# Patient Record
Sex: Female | Born: 1980
Health system: Southern US, Community
[De-identification: ages and names within clinical notes are randomized; demographics above are authoritative.]

## PROBLEM LIST (undated history)

## (undated) ENCOUNTER — Emergency Department (HOSPITAL_COMMUNITY): Admission: EM

## (undated) DIAGNOSIS — Z1379 Encounter for other screening for genetic and chromosomal anomalies: Principal | ICD-10-CM

## (undated) DIAGNOSIS — N939 Abnormal uterine and vaginal bleeding, unspecified: Secondary | ICD-10-CM

## (undated) DIAGNOSIS — I10 Essential (primary) hypertension: Secondary | ICD-10-CM

## (undated) DIAGNOSIS — Z800 Family history of malignant neoplasm of digestive organs: Secondary | ICD-10-CM

## (undated) DIAGNOSIS — Z803 Family history of malignant neoplasm of breast: Secondary | ICD-10-CM

## (undated) DIAGNOSIS — R011 Cardiac murmur, unspecified: Secondary | ICD-10-CM

## (undated) DIAGNOSIS — D259 Leiomyoma of uterus, unspecified: Secondary | ICD-10-CM

## (undated) DIAGNOSIS — R05 Cough: Secondary | ICD-10-CM

## (undated) DIAGNOSIS — R058 Other specified cough: Secondary | ICD-10-CM

## (undated) HISTORY — DX: Family history of malignant neoplasm of digestive organs: Z80.0

## (undated) HISTORY — DX: Encounter for other screening for genetic and chromosomal anomalies: Z13.79

## (undated) HISTORY — DX: Family history of malignant neoplasm of breast: Z80.3

---
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
## 2004-06-11 ENCOUNTER — Inpatient Hospital Stay (HOSPITAL_COMMUNITY): Admission: AD | Admit: 2004-06-11 | Discharge: 2004-06-11 | Payer: Self-pay | Admitting: Obstetrics and Gynecology

## 2004-09-03 ENCOUNTER — Ambulatory Visit (HOSPITAL_COMMUNITY): Admission: RE | Admit: 2004-09-03 | Discharge: 2004-09-03 | Payer: Self-pay | Admitting: Obstetrics and Gynecology

## 2004-11-20 ENCOUNTER — Inpatient Hospital Stay (HOSPITAL_COMMUNITY): Admission: AD | Admit: 2004-11-20 | Discharge: 2004-11-20 | Payer: Self-pay | Admitting: Obstetrics & Gynecology

## 2004-11-20 ENCOUNTER — Ambulatory Visit: Payer: Self-pay | Admitting: Family Medicine

## 2004-11-21 ENCOUNTER — Inpatient Hospital Stay (HOSPITAL_COMMUNITY): Admission: AD | Admit: 2004-11-21 | Discharge: 2004-11-21 | Payer: Self-pay | Admitting: *Deleted

## 2005-01-04 ENCOUNTER — Inpatient Hospital Stay (HOSPITAL_COMMUNITY): Admission: AD | Admit: 2005-01-04 | Discharge: 2005-01-06 | Payer: Self-pay | Admitting: Obstetrics & Gynecology

## 2005-01-04 ENCOUNTER — Ambulatory Visit: Payer: Self-pay | Admitting: Certified Nurse Midwife

## 2006-06-13 ENCOUNTER — Emergency Department (HOSPITAL_COMMUNITY): Admission: EM | Admit: 2006-06-13 | Discharge: 2006-06-13 | Payer: Self-pay | Admitting: Emergency Medicine

## 2006-10-11 ENCOUNTER — Emergency Department (HOSPITAL_COMMUNITY): Admission: EM | Admit: 2006-10-11 | Discharge: 2006-10-11 | Payer: Self-pay | Admitting: Emergency Medicine

## 2009-03-01 ENCOUNTER — Ambulatory Visit (HOSPITAL_COMMUNITY): Admission: RE | Admit: 2009-03-01 | Discharge: 2009-03-01 | Payer: Self-pay | Admitting: Obstetrics and Gynecology

## 2009-03-01 HISTORY — PX: OTHER SURGICAL HISTORY: SHX169

## 2010-01-04 ENCOUNTER — Emergency Department (HOSPITAL_COMMUNITY): Admission: EM | Admit: 2010-01-04 | Discharge: 2010-01-04 | Payer: Self-pay | Admitting: Emergency Medicine

## 2010-01-31 ENCOUNTER — Emergency Department (HOSPITAL_BASED_OUTPATIENT_CLINIC_OR_DEPARTMENT_OTHER): Admission: EM | Admit: 2010-01-31 | Discharge: 2010-01-31 | Payer: Self-pay | Admitting: Emergency Medicine

## 2010-06-24 LAB — CBC
HCT: 35.3 % — ABNORMAL LOW (ref 36.0–46.0)
MCV: 82.9 fL (ref 78.0–100.0)
Platelets: 261 10*3/uL (ref 150–400)
WBC: 6.7 10*3/uL (ref 4.0–10.5)

## 2010-06-24 LAB — PREGNANCY, URINE: Preg Test, Ur: NEGATIVE

## 2012-05-12 ENCOUNTER — Ambulatory Visit (INDEPENDENT_AMBULATORY_CARE_PROVIDER_SITE_OTHER): Payer: 59 | Admitting: Family Medicine

## 2012-05-12 VITALS — BP 128/83 | HR 76 | Temp 98.9°F | Resp 16 | Ht 66.5 in | Wt 233.0 lb

## 2012-05-12 DIAGNOSIS — B009 Herpesviral infection, unspecified: Secondary | ICD-10-CM

## 2012-05-12 DIAGNOSIS — H5789 Other specified disorders of eye and adnexa: Secondary | ICD-10-CM

## 2012-05-12 DIAGNOSIS — H579 Unspecified disorder of eye and adnexa: Secondary | ICD-10-CM

## 2012-05-12 DIAGNOSIS — R109 Unspecified abdominal pain: Secondary | ICD-10-CM

## 2012-05-12 DIAGNOSIS — I499 Cardiac arrhythmia, unspecified: Secondary | ICD-10-CM

## 2012-05-12 DIAGNOSIS — I493 Ventricular premature depolarization: Secondary | ICD-10-CM

## 2012-05-12 DIAGNOSIS — R5383 Other fatigue: Secondary | ICD-10-CM

## 2012-05-12 DIAGNOSIS — R6883 Chills (without fever): Secondary | ICD-10-CM

## 2012-05-12 DIAGNOSIS — R5381 Other malaise: Secondary | ICD-10-CM

## 2012-05-12 DIAGNOSIS — I4949 Other premature depolarization: Secondary | ICD-10-CM

## 2012-05-12 DIAGNOSIS — D649 Anemia, unspecified: Secondary | ICD-10-CM

## 2012-05-12 LAB — POCT CBC
MCHC: 31 g/dL — AB (ref 31.8–35.4)
MID (cbc): 0.6 (ref 0–0.9)
Platelet Count, POC: 235 10*3/uL (ref 142–424)
RBC: 4.46 M/uL (ref 4.04–5.48)
RDW, POC: 15.2 %
WBC: 4.6 10*3/uL (ref 4.6–10.2)

## 2012-05-12 LAB — BASIC METABOLIC PANEL
CO2: 28 mEq/L (ref 19–32)
Calcium: 9.3 mg/dL (ref 8.4–10.5)
Creat: 0.67 mg/dL (ref 0.50–1.10)

## 2012-05-12 MED ORDER — VALACYCLOVIR HCL 1 G PO TABS: ORAL_TABLET | ORAL | Status: DC

## 2012-05-12 NOTE — Progress Notes (Signed)
Subjective: 32 year old lady who has been feeling bad for about 5 days. Sunday she started feeling fatigued. She also had stomach pains. No nausea or vomiting. She had cramping. On Monday she felt lousy with more of the fatigue and stomach pains. Teacher. 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 Today she feels bad again. She is broken out with a fever blister at the left side of her mouth and lateral to the left eye. She's never had these before.  Objective No acute distress. Her eyes appear normal. There is the herpetic-looking lesion just lateral to the left lateral epicanthal fold. Her mouth has a small blister just lateral to the lips at the ankle. Throat was clear. Neck supple without nodes. Chest is clear to auscultation. Heart has moderately frequent ectopic beats. And soft without mass or tenderness.  Assessment: Herpetic ulcer x2 Fatigue Abdominal discomfort Menstrual onset Cardiac ectopy  Plan: Check EKG, HSV titers, CBC  Will treat with Valtrex  Results for orders placed in visit on 05/12/12  POCT CBC      Result Value Range   WBC 4.6  4.6 - 10.2 K/uL   Lymph, poc 2.1  0.6 - 3.4   POC LYMPH PERCENT 46.1  10 - 50 %L   MID (cbc) 0.6  0 - 0.9   POC MID % 13.0 (*) 0 - 12 %M   POC Granulocyte 1.9 (*) 2 - 6.9   Granulocyte percent 40.9  37 - 80 %G   RBC 4.46  4.04 - 5.48 M/uL   Hemoglobin 11.3 (*) 12.2 - 16.2 g/dL   HCT, POC 40.9 (*) 81.1 - 47.9 %   MCV 81.6  80 - 97 fL   MCH, POC 25.3 (*) 27 - 31.2 pg   MCHC 31.0 (*) 31.8 - 35.4 g/dL   RDW, POC 91.4     Platelet Count, POC 235  142 - 424 K/uL   MPV 9.5  0 - 99.8 fL   Diagnoses: HSV 1 Viral syndrome PVCs Fatigue Anemia  Plan: Take iron Valtrex Reassurance about the palpitations Return if needed

## 2012-05-12 NOTE — Patient Instructions (Addendum)
Take Valtrex as directed  Return if worse. If any concern about eye involvement come in immediately.  If palpitations have her give you symptoms that are concerning you come back.

## 2012-05-13 LAB — HSV(HERPES SIMPLEX VRS) I + II AB-IGG
HSV 1 Glycoprotein G Ab, IgG: 8.71 IV — ABNORMAL HIGH
HSV 2 Glycoprotein G Ab, IgG: 0.17 IV

## 2012-07-18 ENCOUNTER — Ambulatory Visit (INDEPENDENT_AMBULATORY_CARE_PROVIDER_SITE_OTHER): Payer: 59 | Admitting: Family Medicine

## 2012-07-18 VITALS — BP 131/84 | HR 83 | Temp 98.9°F | Resp 17 | Ht 66.5 in | Wt 240.0 lb

## 2012-07-18 DIAGNOSIS — H6120 Impacted cerumen, unspecified ear: Secondary | ICD-10-CM

## 2012-07-18 DIAGNOSIS — J029 Acute pharyngitis, unspecified: Secondary | ICD-10-CM

## 2012-07-18 DIAGNOSIS — J020 Streptococcal pharyngitis: Secondary | ICD-10-CM

## 2012-07-18 DIAGNOSIS — H9209 Otalgia, unspecified ear: Secondary | ICD-10-CM

## 2012-07-18 DIAGNOSIS — H6121 Impacted cerumen, right ear: Secondary | ICD-10-CM

## 2012-07-18 DIAGNOSIS — H9201 Otalgia, right ear: Secondary | ICD-10-CM

## 2012-07-18 LAB — POCT RAPID STREP A (OFFICE): Rapid Strep A Screen: NEGATIVE

## 2012-07-18 MED ORDER — AMOXICILLIN 875 MG PO TABS
875.0000 mg | ORAL_TABLET | Freq: Two times a day (BID) | ORAL | Status: DC
Start: 2012-07-18 — End: 2013-10-02

## 2012-07-18 NOTE — Patient Instructions (Addendum)
Strep Throat Strep throat is an infection of the throat caused by a bacteria named Streptococcus pyogenes. Your caregiver may call the infection streptococcal "tonsillitis" or "pharyngitis" depending on whether there are signs of inflammation in the tonsils or back of the throat. Strep throat is most common in children from 74 to 34 years old during the cold months of the year, but it can occur in people of any age during any season. This infection is spread from person to person (contagious) through coughing, sneezing, or other close contact. SYMPTOMS   Fever or chills.  Painful, swollen, red tonsils or throat.  Pain or difficulty when swallowing.  White or yellow spots on the tonsils or throat.  Swollen, tender lymph nodes or "glands" of the neck or under the jaw.  Red rash all over the body (rare). DIAGNOSIS  Many different infections can cause the same symptoms. A test must be done to confirm the diagnosis so the right treatment can be given. A "rapid strep test" can help your caregiver make the diagnosis in a few minutes. If this test is not available, a light swab of the infected area can be used for a throat culture test. If a throat culture test is done, results are usually available in a day or two. TREATMENT  Strep throat is treated with antibiotic medicine. HOME CARE INSTRUCTIONS   Gargle with 1 tsp of salt in 1 cup of warm water, 3 to 4 times per day or as needed for comfort.  Family members who also have a sore throat or fever should be tested for strep throat and treated with antibiotics if they have the strep infection.  Make sure everyone in your household washes their hands well.  Do not share food, drinking cups, or personal items that could cause the infection to spread to others.  You may need to eat a soft food diet until your sore throat gets better.  Drink enough water and fluids to keep your urine clear or pale yellow. This will help prevent dehydration.  Get  plenty of rest.  Stay home from school, daycare, or work until you have been on antibiotics for 24 hours.  Only take over-the-counter or prescription medicines for pain, discomfort, or fever as directed by your caregiver.  If antibiotics are prescribed, take them as directed. Finish them even if you start to feel better. SEEK MEDICAL CARE IF:   The glands in your neck continue to enlarge.  You develop a rash, cough, or earache.  You cough up green, yellow-brown, or bloody sputum.  You have pain or discomfort not controlled by medicines.  Your problems seem to be getting worse rather than better. SEEK IMMEDIATE MEDICAL CARE IF:   You develop any new symptoms such as vomiting, severe headache, stiff or painful neck, chest pain, shortness of breath, or trouble swallowing.  You develop severe throat pain, drooling, or changes in your voice.  You develop swelling of the neck, or the skin on the neck becomes red and tender.  You have a fever.  You develop signs of dehydration, such as fatigue, dry mouth, and decreased urination.  You become increasingly sleepy, or you cannot wake up completely. Document Released: 03/06/2000 Document Revised: 06/01/2011 Document Reviewed: 05/08/2010 [facility] Patient Information 2013 [place], [place].    Amoxicillin one twice daily

## 2012-07-18 NOTE — Progress Notes (Signed)
Subjective: Patient is here with a sore throat the last 2 or 3 days. Last night she had a lot of pain in her right ear during the night.  I inquired about whether she taken iron since her last visit, and she had not. Somehow I did not adequately communicate that to her, but she was a little anemic. I have talked with her and advised her to take an over-the-counter iron pill one daily for the next couple of months.  Objective: Her left TM is normal and there is no wax in the canal. The right TM is occluded by wax. I was able to pick around the corner of the wax and see the posterior aspect of the drum which does not look inflamed. We will try to irrigate the wax out. Her throat is erythematous, with a tiny bit of exudate on the right. Strep screen was taken. Neck supple without significant nodes. Chest is clear. Heart regular.  Assessment: Pharyngitis Right otitis Cerumen impaction  Plan: Irrigate the wax out of the ear and get a better look at the drum. Strep screen is pending. Then we'll decide treatment.  Strep screen is positive Patient has normal tympanic membranes Treat her with amoxicillin

## 2013-01-26 ENCOUNTER — Other Ambulatory Visit: Payer: Self-pay

## 2013-10-02 ENCOUNTER — Ambulatory Visit (INDEPENDENT_AMBULATORY_CARE_PROVIDER_SITE_OTHER): Payer: 59 | Admitting: Emergency Medicine

## 2013-10-02 VITALS — BP 132/86 | HR 60 | Temp 98.2°F | Resp 16 | Ht 66.0 in | Wt 230.0 lb

## 2013-10-02 DIAGNOSIS — IMO0002 Reserved for concepts with insufficient information to code with codable children: Secondary | ICD-10-CM

## 2013-10-02 DIAGNOSIS — S83207A Unspecified tear of unspecified meniscus, current injury, left knee, initial encounter: Secondary | ICD-10-CM

## 2013-10-02 MED ORDER — ACETAMINOPHEN-CODEINE #3 300-30 MG PO TABS
1.0000 | ORAL_TABLET | ORAL | Status: DC | PRN
Start: 2013-10-02 — End: 2015-04-09

## 2013-10-02 MED ORDER — NAPROXEN SODIUM 550 MG PO TABS: 550.0000 mg | ORAL_TABLET | Freq: Two times a day (BID) | ORAL | Status: AC

## 2013-10-02 NOTE — Progress Notes (Signed)
[facility] [address] [place] [address] [phone]  Date:  10/02/2013   Name:  Kristen Hines   DOB:  November 01, 1980   MRN:  [id number]  PCP:  Kristine Garbe, MD    Chief Complaint: Knee Pain   History of Present Illness:  Kristen Hines is a 33 y.o. very pleasant female patient who presents with the following:  History of meniscus tear right knee.  Was on a 4 mile walk and developed pain in left knee the following day.  Has a click with flexion and extension but is not able to flex past 90 degrees.  Pain is lessening with elevation and ice.  No history of direct injury.  No improvement with over the counter medications or other home remedies. Denies other complaint or health concern today.   There are no active problems to display for this patient.   History reviewed. No pertinent past medical history.  Past Surgical History  Procedure Laterality Date  . Tubal ligation    . Ablation      History  Substance Use Topics  . Smoking status: Never Smoker   . Smokeless tobacco: Not on file  . Alcohol Use: No    Family History  Problem Relation Age of Onset  . Hypertension Mother   . Breast cancer Mother   . Hypertension Father   . Heart disease Father   . Diabetes Father     No Known Allergies  Medication list has been reviewed and updated.  No current outpatient prescriptions on file prior to visit.   No current facility-administered medications on file prior to visit.    Review of Systems:  As per HPI, otherwise negative.    Physical Examination: Filed Vitals:   10/02/13 1850  BP: 132/86  Pulse: 60  Temp: 98.2 F (36.8 C)  Resp: 16   Filed Vitals:   10/02/13 1850  Height: 5\' 6"  (1.676 m)  Weight: 230 lb (104.327 kg)   Body mass index is 37.14 kg/(m^2). Ideal Body Weight: Weight in (lb) to have BMI = 25: 154.6   GEN: WDWN, NAD, Non-toxic, Alert & Oriented x 3 HEENT: Atraumatic, Normocephalic.  Ears and Nose: No  external deformity. EXTR: No clubbing/cyanosis/edema NEURO: Normal gait.  PSYCH: Normally interactive. Conversant. Not depressed or anxious appearing.  Calm demeanor.  LEFT knee:   Little tenderness laterally.  Full extension, 90 degrees of flexion.  Joint stable.  No effusino  Assessment and Plan: Likely meniscus tear Refused crutches Anaprox tyl #3 Ortho if not improved   Signed,  Ellison Carwin, MD

## 2013-10-02 NOTE — Patient Instructions (Signed)
Meniscus Tear with Phase I Rehab The meniscus is a C-shaped cartilage structure, located in the knee joint between the thigh bone (femur) and the shinbone (tibia). Two menisci are located in each knee joint: the inner and outer meniscus. The meniscus acts as an adapter between the thigh bone and shinbone, allowing them to fit properly together. It also functions as a shock absorber, to reduce the stress placed on the knee joint and to help supply nutrients to the knee joint cartilage. As people age, the meniscus begins to harden and become more vulnerable to injury. Meniscus tears are a common injury, especially in older athletes. Inner meniscus tears are more common than outer meniscus tears.  SYMPTOMS   Pain in the knee, especially with standing or squatting with the affected leg.  Tenderness along the joint line.  Swelling in the knee joint (effusion), usually starting 1 to 2 days after injury.  Locking or catching of the knee joint, causing inability to straighten the knee completely.  Giving way or buckling of the knee. CAUSES  A meniscus tear occurs when a force is placed on the meniscus that is greater than it can handle. Common causes of injury include:  Direct hit (trauma) to the knee.  Twisting, pivoting, or cutting (rapidly changing direction while running), kneeling or squatting.  Without injury, due to aging. RISK INCREASES WITH:  Contact sports (football, rugby).  Sports in which cleats are used with pivoting (soccer, lacrosse) or sports in which good shoe grip and sudden change in direction are required (racquetball, basketball, squash).  Previous knee injury.  Associated knee injury, particularly ligament injuries.  Poor strength and flexibility. PREVENTION  Warm up and stretch properly before activity.  Maintain physical fitness:  Strength, flexibility, and endurance.  Cardiovascular fitness.  Protect the knee with a brace or elastic bandage.  Wear  properly fitted protective equipment (proper cleats for the surface). PROGNOSIS  Sometimes, meniscus tears heal on their own. However, definitive treatment requires surgery, followed by at least 6 weeks of recovery.  RELATED COMPLICATIONS   Recurring symptoms that result in a chronic problem.  Repeated knee injury, especially if sports are resumed too soon after injury or surgery.  Progression of the tear (the tear gets larger), if untreated.  Arthritis of the knee in later years (with or without surgery).  Complications of surgery, including infection, bleeding, injury to nerves (numbness, weakness, paralysis) continued pain, giving way, locking, nonhealing of meniscus (if repaired), need for further surgery, and knee stiffness (loss of motion). TREATMENT  Treatment first involves the use of ice and medicine, to reduce pain and inflammation. You may find using crutches to walk more comfortable. However, it is okay to bear weight on the injured knee, if the pain will allow it. Surgery is often advised as a definitive treatment. Surgery is performed through an incision near the joint (arthroscopically). The torn piece of the meniscus is removed, and if possible the joint cartilage is repaired. After surgery, the joint must be restrained. After restraint, it is important to perform strengthening and stretching exercises to help regain strength and a full range of motion. These exercises may be completed at home or with a therapist.  MEDICATION  If pain medicine is needed, nonsteroidal anti-inflammatory medicines (aspirin and ibuprofen), or other minor pain relievers (acetaminophen), are often advised.  Do not take pain medicine for 7 days before surgery.  Prescription pain relievers may be given, if your caregiver thinks they are needed. Use only as directed and   only as much as you need. HEAT AND COLD  Cold treatment (icing) should be applied for 10 to 15 minutes every 2 to 3 hours for  inflammation and pain, and immediately after activity that aggravates your symptoms. Use ice packs or an ice massage.  Heat treatment may be used before performing stretching and strengthening activities prescribed by your caregiver, physical therapist, or athletic trainer. Use a heat pack or a warm water soak. SEEK MEDICAL CARE IF:   Symptoms get worse or do not improve in 2 weeks, despite treatment.  New, unexplained symptoms develop. (Drugs used in treatment may produce side effects.) EXERCISES RANGE OF MOTION (ROM) AND STRETCHING EXERCISES - Meniscus Tear, Non-operative, Phase I These are some of the initial exercises with which you may start your rehabilitation program, until you see your caregiver again or until your symptoms are resolved. Remember:   These initial exercises are intended to be gentle. They will help you restore motion without increasing any swelling.  Completing these exercises allows less painful movement and prepares you for the more aggressive strengthening exercises in Phase II.  An effective stretch should be held for at least 30 seconds.  A stretch should never be painful. You should only feel a gentle lengthening or release in the stretched tissue. RANGE OF MOTION - Knee Flexion, Active  Lie on your back with both knees straight. (If this causes back discomfort, bend your healthy knee, placing your foot flat on the floor.)  Slowly slide your heel back toward your buttocks until you feel a gentle stretch in the front of your knee or thigh.  Hold for __________ seconds. Slowly slide your heel back to the starting position. Repeat __________ times. Complete this exercise __________ times per day.  RANGE OF MOTION - Knee Flexion and Extension, Active-Assisted  Sit on the edge of a table or chair with your thighs firmly supported. It may be helpful to place a folded towel under the end of your right / left thigh.  Flexion (bending): Place the ankle of your  healthy leg on top of the other ankle. Use your healthy leg to gently bend your right / left knee until you feel a mild tension across the top of your knee.  Hold for __________ seconds.  Extension (straightening): Switch your ankles so your right / left leg is on top. Use your healthy leg to straighten your right / left knee until you feel a mild tension on the backside of your knee.  Hold for __________ seconds. Repeat __________ times. Complete __________ times per day. STRETCH - Knee Flexion, Supine  Lie on the floor with your right / left heel and foot lightly touching the wall. (Place both feet on the wall if you do not use a door frame.)  Without using any effort, allow gravity to slide your foot down the wall slowly until you feel a gentle stretch in the front of your right / left knee.  Hold this stretch for __________ seconds. Then return the leg to the starting position, using your healthy leg for help, if needed. Repeat __________ times. Complete this stretch __________ times per day.  STRETCH - Knee Extension Sitting  Sit with your right / left leg/heel propped on another chair, coffee table, or foot stool.  Allow your leg muscles to relax, letting gravity straighten out your knee.*  You should feel a stretch behind your right / left knee. Hold this position for __________ seconds. Repeat __________ times. Complete this stretch __________   times per day.  *Your physician, physical therapist or athletic trainer may instruct you place a __________ weight on your thigh, just above your kneecap, to deepen the stretch.  STRENGTHENING EXERCISES - Meniscus Tear, Non-operative, Phase I These exercises may help you when beginning to rehabilitate your injury. They may resolve your symptoms with or without further involvement from your physician, physical therapist or athletic trainer. While completing these exercises, remember:   Muscles can gain both the endurance and the strength  needed for everyday activities through controlled exercises.  Complete these exercises as instructed by your physician, physical therapist or athletic trainer. Progress the resistance and repetitions only as guided. STRENGTH - Quadriceps, Isometrics  Lie on your back with your right / left leg extended and your opposite knee bent.  Gradually tense the muscles in the front of your right / left thigh. You should see either your knee cap slide up toward your hip or increased dimpling just above the knee. This motion will push the back of the knee down toward the floor, mat, or bed on which you are lying.  Hold the muscle as tight as you can, without increasing your pain, for __________ seconds.  Relax the muscles slowly and completely between each repetition. Repeat __________ times. Complete this exercise __________ times per day.  STRENGTH - Quadriceps, Short Arcs   Lie on your back. Place a __________ inch towel roll under your right / left knee, so that the knee bends slightly.  Raise only your lower leg by tightening the muscles in the front of your thigh. Do not allow your thigh to rise.  Hold this position for __________ seconds. Repeat __________ times. Complete this exercise __________ times per day.  OPTIONAL ANKLE WEIGHTS: Begin with ____________________, but DO NOT exceed ____________________. Increase in 1 pound/0.5 kilogram increments. STRENGTH - Quadriceps, Straight Leg Raises  Quality counts! Watch for signs that the quadriceps muscle is working, to be sure you are strengthening the correct muscles and not "cheating" by substituting with healthier muscles.  Lay on your back with your right / left leg extended and your opposite knee bent.  Tense the muscles in the front of your right / left thigh. You should see either your knee cap slide up or increased dimpling just above the knee. Your thigh may even shake a bit.  Tighten these muscles even more and raise your leg 4 to 6  inches off the floor. Hold for __________ seconds.  Keeping these muscles tense, lower your leg.  Relax the muscles slowly and completely in between each repetition. Repeat __________ times. Complete this exercise __________ times per day.  STRENGTH - Hamstring, Curls   Lay on your stomach with your legs extended. (If you lay on a bed, your feet may hang over the edge.)  Tighten the muscles in the back of your thigh to bend your right / left knee up to 90 degrees. Keep your hips flat on the bed.  Hold this position for __________ seconds.  Slowly lower your leg back to the starting position. Repeat __________ times. Complete this exercise __________ times per day.  STRENGTH - Quadriceps, Squats  Stand in a door frame so that your feet and knees are in line with the frame.  Use your hands for balance, not support, on the frame.  Slowly lower your weight, bending at the hips and knees. Keep your lower legs upright so that they are parallel with the door frame. Squat only within the range that does  not increase your knee pain. Never let your hips drop below your knees.  Slowly return upright, pushing with your legs, not pulling with your hands. Repeat __________ times. Complete this exercise __________ times per day.  STRENGTH - Quad/VMO, Isometric   Sit in a chair with your right / left knee slightly bent. With your fingertips, feel the VMO muscle just above the inside of your knee. The VMO is important in controlling the position of your kneecap.  Keeping your fingertips on this muscle. Without actually moving your leg, attempt to drive your knee down as if straightening your leg. You should feel your VMO tense. If you have a difficult time, you may wish to try the same exercise on your healthy knee first.  Tense this muscle as hard as you can without increasing any knee pain.  Hold for __________ seconds. Relax the muscles slowly and completely in between each repetition. Repeat  __________ times. Complete exercise __________ times per day.  Document Released: 03/23/1998 Document Revised: 06/01/2011 Document Reviewed: 06/21/2008 [employer] Patient Information 2015 [employer]. This information is not intended to replace advice given to you by your health care provider. Make sure you discuss any questions you have with your health care provider.  

## 2015-02-25 ENCOUNTER — Other Ambulatory Visit: Payer: Self-pay | Admitting: Obstetrics and Gynecology

## 2015-02-25 DIAGNOSIS — R928 Other abnormal and inconclusive findings on diagnostic imaging of breast: Secondary | ICD-10-CM

## 2015-02-28 NOTE — H&P (Addendum)
Kristen Hines  DICTATION # [id number] CSN# [id number]   Margarette Asal, MD 02/28/2015 8:23 AM

## 2015-02-28 NOTE — H&P (Signed)
Kristen Hines, MATHIOWETZ NO.:  [id number]  MEDICAL RECORD NO.:  [id number]  LOCATION:                               FACILITY:  [facility]  PHYSICIAN:  Ralene Bathe. Matthew Saras, M.D.DATE OF BIRTH:  1980/12/04  DATE OF ADMISSION:  04/16/2015 DATE OF DISCHARGE:                             HISTORY & PHYSICAL   CHIEF COMPLAINT:  Menorrhagia, dysmenorrhea.  HPI:  [name], G2, P2.  She has had 2 vaginal deliveries, prior tubal ligation.  She has a history of prior ablation in 2010, but continues now to have significant problems related to bleeding and menstrual pain.  Ultrasound evaluation in our office dated February 22, 2015, demonstrated 4.8 x 3.7 intramural fibroid.  Adnexa unremarkable. She presents now for LAVH bilateral salpingectomy.  This procedure including specific risks related to bleeding, infection, transfusion, adjacent organ injury, possible need to complete the surgery by open technique, rationale for bilateral salpingectomy, all discussed with her which she understands and accepts.  PAST MEDICAL HISTORY:  Allergies:  None.  PAST SURGICAL HISTORY:  Endometrial ablation in 2010, also tubal ligation.  CURRENT MEDICATIONS:  Motrin p.r.n.  FAMILY HISTORY:  Significant for diabetes.  SOCIAL HISTORY:  Denies alcohol, tobacco, or drug use.  PHYSICAL EXAM:  VITAL SIGNS:  Temp 98.2, blood pressure 130/88. HEENT:  Unremarkable. NECK:  Supple without masses. LUNGS:  Clear. CARDIOVASCULAR:  Regular rate and rhythm without murmurs, rubs, gallops noted. BREASTS:  Without masses. ABDOMEN:  Soft, flat, nontender.  Vulva, vagina, cervix normal.  Uterus upper limit normal size, mobile.  Adnexa negative.  Last Pap, November 2016, was normal except for trich, which was treated with tinidazole. EXTREMITIES:  Unremarkable. NEUROLOGIC:  Unremarkable.  IMPRESSION:  Menorrhagia and dysmenorrhea, failed ablation, leiomyoma.  PLAN:  LAVH, bilateral salpingectomy.   Procedure and risks discussed as above.     Yaneth Fairbairn M. Matthew Saras, M.D.     [facility]/MEDQ  D:  02/28/2015  T:  02/28/2015  Job:  [id number]

## 2015-03-04 ENCOUNTER — Ambulatory Visit
Admission: RE | Admit: 2015-03-04 | Discharge: 2015-03-04 | Disposition: A | Discharge status: Home / Self Care | Payer: 59 | Source: Ambulatory Visit | Attending: Obstetrics and Gynecology | Admitting: Obstetrics and Gynecology

## 2015-03-04 DIAGNOSIS — R928 Other abnormal and inconclusive findings on diagnostic imaging of breast: Secondary | ICD-10-CM

## 2015-04-03 NOTE — H&P (Signed)
Kristen Kristen Hines, Kristen Hines NO.:  [id number]  MEDICAL RECORD NO.:  [id number]  LOCATION:                                 FACILITY:  PHYSICIAN:  Ralene Bathe. Matthew Saras, M.D.    DATE OF BIRTH:  DATE OF ADMISSION:  04/16/2015 DATE OF DISCHARGE:                             HISTORY & PHYSICAL   CHIEF COMPLAINT:  Symptomatic fibroids and menorrhagia, status post ablation.  HPI:  [name], G2, P2, prior tubal and an endometrial ablation in 2010.  She continues to have problems with menorrhagia.  Last ultrasound 02/22/2015 showed a 4.8 x 3.7 intramural fibroid.  Adnexa negative.  She presents at this time for a definitive LAVH bilateral salpingectomy. This procedure including specific risks related to bleeding, infection, adjacent organ injury, transfusion, wound infection, phlebitis, the possibility of having to complete the surgery by an open technique.  All reviewed with her, which she understands and accepts.  PAST MEDICAL HISTORY:  She has had 2 vaginal deliveries and a resection in 1998, endometrial ablation in 2010 at the time of tubal ligation.  At the time of her ablation, no significant abnormalities were noted.  CURRENT MEDICATIONS:  None.  FAMILY HISTORY:  Significant for history of migraine headache, heart disease, and diabetes.  SOCIAL HISTORY:  Denies alcohol, tobacco or drug use.  PHYSICAL EXAMINATION:  VITAL SIGNS:  Temp 98.2 and blood pressure 150/100. HEENT:  Unremarkable. NECK:  Supple without masses. LUNGS:  Clear. CARDIOVASCULAR:  Regular rate and rhythm without murmurs, rubs or gallops. BREASTS:  Without masses. ABDOMEN:  Soft, flat, and nontender.  Vulva, vagina, and cervix normal. Uterus upper limit of normal size.  Adnexa negative and mobile.  Most recent Pap was normal. EXTREMITIES:  Unremarkable. NEUROLOGIC:  Unremarkable.  IMPRESSION:  Symptomatic leiomyoma, menorrhagia, and failed ablation.  PLAN:  LAVH, bilateral salpingectomy.   Procedure and risks discussed as above.     Zeanna Sunde M. Matthew Saras, M.D.     RMH/MEDQ  D:  04/02/2015  T:  04/02/2015  Job:  [id number]

## 2015-04-09 ENCOUNTER — Encounter (HOSPITAL_BASED_OUTPATIENT_CLINIC_OR_DEPARTMENT_OTHER): Payer: Self-pay | Admitting: *Deleted

## 2015-04-09 NOTE — Progress Notes (Signed)
NPO AFTER MN.  ARRIVE AT 0600.  NEEDS URINE PREG.  GETTING LAB WORK DONE MON. 04-15-2015 (CBC, CMET, T & S).

## 2015-04-15 LAB — COMPREHENSIVE METABOLIC PANEL
ALBUMIN: 4.1 g/dL (ref 3.5–5.0)
ALT: 13 U/L — AB (ref 14–54)
ANION GAP: 6 (ref 5–15)
AST: 13 U/L — ABNORMAL LOW (ref 15–41)
Alkaline Phosphatase: 53 U/L (ref 38–126)
BUN: 10 mg/dL (ref 6–20)
CHLORIDE: 106 mmol/L (ref 101–111)
CO2: 29 mmol/L (ref 22–32)
Calcium: 9.1 mg/dL (ref 8.9–10.3)
Creatinine, Ser: 0.85 mg/dL (ref 0.44–1.00)
GFR calc non Af Amer: >60 mL/min (ref >60)
Glucose, Bld: 90 mg/dL (ref 65–99)
Potassium: 4.2 mmol/L (ref 3.5–5.1)
SODIUM: 141 mmol/L (ref 135–145)
Total Bilirubin: 0.5 mg/dL (ref 0.3–1.2)
Total Protein: 7.3 g/dL (ref 6.5–8.1)

## 2015-04-15 LAB — TYPE AND SCREEN
ABO/RH(D): O POS
ANTIBODY SCREEN: NEGATIVE

## 2015-04-15 LAB — CBC
HCT: 34.1 % — ABNORMAL LOW (ref 36.0–46.0)
Hemoglobin: 10.7 g/dL — ABNORMAL LOW (ref 12.0–15.0)
MCH: 25.8 pg — AB (ref 26.0–34.0)
MCHC: 31.4 g/dL (ref 30.0–36.0)
MCV: 82.2 fL (ref 78.0–100.0)
PLATELETS: 217 10*3/uL (ref 150–400)
RBC: 4.15 MIL/uL (ref 3.87–5.11)
RDW: 13.8 % (ref 11.5–15.5)
WBC: 5.2 10*3/uL (ref 4.0–10.5)

## 2015-04-15 LAB — ABO/RH: ABO/RH(D): O POS

## 2015-04-15 NOTE — Anesthesia Preprocedure Evaluation (Addendum)
Anesthesia Evaluation  Patient identified by MRN, date of birth, ID band Patient awake    Reviewed: Allergy & Precautions, NPO status , Patient's Chart, lab work & pertinent test results  History of Anesthesia Complications Negative for: history of anesthetic complications  Airway Mallampati: II  TM Distance: >3 FB Neck ROM: Full    Dental no notable dental hx. (+) Dental Advisory Given, Missing   Pulmonary neg pulmonary ROS,    Pulmonary exam normal breath sounds clear to auscultation       Cardiovascular negative cardio ROS Normal cardiovascular exam+ Valvular Problems/Murmurs  Rhythm:Regular Rate:Normal     Neuro/Psych negative neurological ROS  negative psych ROS   GI/Hepatic negative GI ROS, Neg liver ROS,   Endo/Other  obesity  Renal/GU negative Renal ROS  Female GU complaint  negative genitourinary   Musculoskeletal negative musculoskeletal ROS (+)   Abdominal   Peds negative pediatric ROS (+)  Hematology negative hematology ROS (+)   Anesthesia Other Findings   Reproductive/Obstetrics negative OB ROS                            Anesthesia Physical Anesthesia Plan  ASA: II  Anesthesia Plan: General   Post-op Pain Management:    Induction: Intravenous  Airway Management Planned: Oral ETT  Additional Equipment:   Intra-op Plan:   Post-operative Plan: Extubation in OR  Informed Consent: I have reviewed the patients History and Physical, chart, labs and discussed the procedure including the risks, benefits and alternatives for the proposed anesthesia with the patient or authorized representative who has indicated his/her understanding and acceptance.   Dental advisory given  Plan Discussed with: CRNA  Anesthesia Plan Comments:         Anesthesia Quick Evaluation

## 2015-04-16 ENCOUNTER — Inpatient Hospital Stay (HOSPITAL_BASED_OUTPATIENT_CLINIC_OR_DEPARTMENT_OTHER)
Admission: AD | Admit: 2015-04-16 | Discharge: 2015-04-18 | DRG: 743 | Disposition: A | Discharge status: Home / Self Care | Payer: 59 | Source: Ambulatory Visit | Attending: Obstetrics and Gynecology | Admitting: Obstetrics and Gynecology

## 2015-04-16 ENCOUNTER — Ambulatory Visit (HOSPITAL_BASED_OUTPATIENT_CLINIC_OR_DEPARTMENT_OTHER): Payer: 59 | Admitting: Anesthesiology

## 2015-04-16 ENCOUNTER — Encounter (HOSPITAL_BASED_OUTPATIENT_CLINIC_OR_DEPARTMENT_OTHER): Payer: Self-pay | Admitting: *Deleted

## 2015-04-16 ENCOUNTER — Encounter (HOSPITAL_COMMUNITY)
Admission: AD | Disposition: A | Discharge status: Home / Self Care | Payer: Self-pay | Source: Ambulatory Visit | Attending: Obstetrics and Gynecology

## 2015-04-16 DIAGNOSIS — Z833 Family history of diabetes mellitus: Secondary | ICD-10-CM

## 2015-04-16 DIAGNOSIS — N946 Dysmenorrhea, unspecified: Secondary | ICD-10-CM | POA: Diagnosis present

## 2015-04-16 DIAGNOSIS — N736 Female pelvic peritoneal adhesions (postinfective): Secondary | ICD-10-CM | POA: Diagnosis present

## 2015-04-16 DIAGNOSIS — D251 Intramural leiomyoma of uterus: Principal | ICD-10-CM | POA: Diagnosis present

## 2015-04-16 DIAGNOSIS — Z9851 Tubal ligation status: Secondary | ICD-10-CM | POA: Diagnosis not present

## 2015-04-16 DIAGNOSIS — N920 Excessive and frequent menstruation with regular cycle: Secondary | ICD-10-CM | POA: Diagnosis present

## 2015-04-16 DIAGNOSIS — Z5331 Laparoscopic surgical procedure converted to open procedure: Secondary | ICD-10-CM

## 2015-04-16 DIAGNOSIS — N939 Abnormal uterine and vaginal bleeding, unspecified: Secondary | ICD-10-CM | POA: Diagnosis present

## 2015-04-16 DIAGNOSIS — Z6837 Body mass index (BMI) 37.0-37.9, adult: Secondary | ICD-10-CM | POA: Diagnosis not present

## 2015-04-16 DIAGNOSIS — K660 Peritoneal adhesions (postprocedural) (postinfection): Secondary | ICD-10-CM | POA: Diagnosis present

## 2015-04-16 DIAGNOSIS — E669 Obesity, unspecified: Secondary | ICD-10-CM | POA: Diagnosis present

## 2015-04-16 HISTORY — DX: Other specified cough: R05.8

## 2015-04-16 HISTORY — DX: Cardiac murmur, unspecified: R01.1

## 2015-04-16 HISTORY — DX: Cough: R05

## 2015-04-16 HISTORY — DX: Leiomyoma of uterus, unspecified: D25.9

## 2015-04-16 HISTORY — PX: ABDOMINAL HYSTERECTOMY: SHX81

## 2015-04-16 HISTORY — DX: Abnormal uterine and vaginal bleeding, unspecified: N93.9

## 2015-04-16 HISTORY — PX: LAPAROSCOPIC VAGINAL HYSTERECTOMY WITH SALPINGECTOMY: SHX6680

## 2015-04-16 SURGERY — HYSTERECTOMY, VAGINAL, LAPAROSCOPY-ASSISTED, WITH SALPINGECTOMY
Anesthesia: General | Site: Abdomen | Laterality: Bilateral

## 2015-04-16 MED ORDER — KETOROLAC TROMETHAMINE 30 MG/ML IJ SOLN
30.0000 mg | Freq: Four times a day (QID) | INTRAMUSCULAR | Status: DC
  Administered 2015-04-16 – 2015-04-18 (×6): 30 mg via INTRAVENOUS
  Filled 2015-04-16 (×11): qty 1

## 2015-04-16 MED ORDER — DEXTROSE IN LACTATED RINGERS 5 % IV SOLN
INTRAVENOUS | Status: DC
  Administered 2015-04-16 – 2015-04-18 (×2): via INTRAVENOUS

## 2015-04-16 MED ORDER — WHITE PETROLATUM GEL
Status: AC
  Filled 2015-04-16: qty 5

## 2015-04-16 MED ORDER — DEXAMETHASONE SODIUM PHOSPHATE 10 MG/ML IJ SOLN
INTRAMUSCULAR | Status: AC
  Filled 2015-04-16: qty 1

## 2015-04-16 MED ORDER — SODIUM CHLORIDE 0.9 % IJ SOLN: 9.0000 mL | INTRAMUSCULAR | Status: DC | PRN

## 2015-04-16 MED ORDER — KETOROLAC TROMETHAMINE 30 MG/ML IJ SOLN
30.0000 mg | Freq: Four times a day (QID) | INTRAMUSCULAR | Status: DC
  Filled 2015-04-16 (×9): qty 1

## 2015-04-16 MED ORDER — MORPHINE SULFATE 2 MG/ML IV SOLN
INTRAVENOUS | Status: DC
  Administered 2015-04-16: 0 mg via INTRAVENOUS
  Administered 2015-04-16: 15:00:00 via INTRAVENOUS
  Administered 2015-04-16 – 2015-04-17 (×2): 1 mg via INTRAVENOUS
  Administered 2015-04-17: 0 mg via INTRAVENOUS
  Filled 2015-04-16: qty 25

## 2015-04-16 MED ORDER — LACTATED RINGERS IV SOLN
INTRAVENOUS | Status: DC
  Administered 2015-04-16 (×3): via INTRAVENOUS
  Filled 2015-04-16: qty 1000

## 2015-04-16 MED ORDER — OXYCODONE HCL 5 MG/5ML PO SOLN
5.0000 mg | Freq: Once | ORAL | Status: DC | PRN
  Filled 2015-04-16: qty 5

## 2015-04-16 MED ORDER — FENTANYL CITRATE (PF) 100 MCG/2ML IJ SOLN
INTRAMUSCULAR | Status: DC | PRN
  Administered 2015-04-16 (×2): 50 ug via INTRAVENOUS
  Administered 2015-04-16: 100 ug via INTRAVENOUS
  Administered 2015-04-16 (×2): 50 ug via INTRAVENOUS

## 2015-04-16 MED ORDER — KETOROLAC TROMETHAMINE 30 MG/ML IJ SOLN
30.0000 mg | Freq: Once | INTRAMUSCULAR | Status: AC
  Administered 2015-04-16: 30 mg via INTRAVENOUS
  Filled 2015-04-16: qty 1

## 2015-04-16 MED ORDER — FENTANYL CITRATE (PF) 100 MCG/2ML IJ SOLN
25.0000 ug | INTRAMUSCULAR | Status: DC | PRN
  Administered 2015-04-16: 25 ug via INTRAVENOUS
  Administered 2015-04-16: 50 ug via INTRAVENOUS
  Administered 2015-04-16: 25 ug via INTRAVENOUS
  Administered 2015-04-16 (×2): 50 ug via INTRAVENOUS
  Filled 2015-04-16: qty 1

## 2015-04-16 MED ORDER — OXYCODONE-ACETAMINOPHEN 5-325 MG PO TABS: 1.0000 | ORAL_TABLET | ORAL | Status: DC | PRN

## 2015-04-16 MED ORDER — MENTHOL 3 MG MT LOZG
1.0000 | LOZENGE | OROMUCOSAL | Status: DC | PRN
Start: 2015-04-16 — End: 2015-04-18

## 2015-04-16 MED ORDER — NEOSTIGMINE METHYLSULFATE 10 MG/10ML IV SOLN
INTRAVENOUS | Status: DC | PRN
  Administered 2015-04-16: 3 mg via INTRAVENOUS

## 2015-04-16 MED ORDER — PROPOFOL 10 MG/ML IV BOLUS
INTRAVENOUS | Status: DC | PRN
  Administered 2015-04-16: 200 mg via INTRAVENOUS

## 2015-04-16 MED ORDER — KETOROLAC TROMETHAMINE 30 MG/ML IJ SOLN
INTRAMUSCULAR | Status: DC | PRN
  Administered 2015-04-16: 30 mg via INTRAVENOUS

## 2015-04-16 MED ORDER — GLYCOPYRROLATE 0.2 MG/ML IJ SOLN
INTRAMUSCULAR | Status: DC | PRN
  Administered 2015-04-16: 0.4 mg via INTRAVENOUS

## 2015-04-16 MED ORDER — NEOSTIGMINE METHYLSULFATE 10 MG/10ML IV SOLN
INTRAVENOUS | Status: AC
  Filled 2015-04-16: qty 1

## 2015-04-16 MED ORDER — PROMETHAZINE HCL 25 MG/ML IJ SOLN
INTRAMUSCULAR | Status: AC
  Filled 2015-04-16: qty 1

## 2015-04-16 MED ORDER — IBUPROFEN 800 MG PO TABS: 800.0000 mg | ORAL_TABLET | Freq: Three times a day (TID) | ORAL | Status: DC | PRN

## 2015-04-16 MED ORDER — ROCURONIUM BROMIDE 100 MG/10ML IV SOLN
INTRAVENOUS | Status: AC
  Filled 2015-04-16: qty 1

## 2015-04-16 MED ORDER — LACTATED RINGERS IR SOLN
Status: DC | PRN
  Administered 2015-04-16: 3000 mL

## 2015-04-16 MED ORDER — ONDANSETRON HCL 4 MG/2ML IJ SOLN
INTRAMUSCULAR | Status: DC | PRN
Start: 2015-04-16 — End: 2015-04-16
  Administered 2015-04-16: 4 mg via INTRAVENOUS

## 2015-04-16 MED ORDER — BUTORPHANOL TARTRATE 1 MG/ML IJ SOLN: 1.0000 mg | INTRAMUSCULAR | Status: DC | PRN

## 2015-04-16 MED ORDER — FENTANYL CITRATE (PF) 100 MCG/2ML IJ SOLN
INTRAMUSCULAR | Status: AC
  Filled 2015-04-16: qty 2

## 2015-04-16 MED ORDER — PROPOFOL 10 MG/ML IV BOLUS
INTRAVENOUS | Status: AC
  Filled 2015-04-16: qty 20

## 2015-04-16 MED ORDER — DEXAMETHASONE SODIUM PHOSPHATE 4 MG/ML IJ SOLN
INTRAMUSCULAR | Status: DC | PRN
  Administered 2015-04-16: 10 mg via INTRAVENOUS

## 2015-04-16 MED ORDER — DIPHENHYDRAMINE HCL 50 MG/ML IJ SOLN: 12.5000 mg | Freq: Four times a day (QID) | INTRAMUSCULAR | Status: DC | PRN

## 2015-04-16 MED ORDER — BUPIVACAINE HCL 0.25 % IJ SOLN
INTRAMUSCULAR | Status: DC | PRN
  Administered 2015-04-16: 10 mL

## 2015-04-16 MED ORDER — SODIUM CHLORIDE 0.9 % IV SOLN
INTRAVENOUS | Status: DC | PRN
  Administered 2015-04-16: 50 mL

## 2015-04-16 MED ORDER — GLYCOPYRROLATE 0.2 MG/ML IJ SOLN
INTRAMUSCULAR | Status: AC
  Filled 2015-04-16: qty 2

## 2015-04-16 MED ORDER — PROMETHAZINE HCL 25 MG/ML IJ SOLN
6.2500 mg | INTRAMUSCULAR | Status: AC | PRN
  Administered 2015-04-16 (×2): 6.25 mg via INTRAVENOUS
  Filled 2015-04-16: qty 1

## 2015-04-16 MED ORDER — ROCURONIUM BROMIDE 100 MG/10ML IV SOLN
INTRAVENOUS | Status: DC | PRN
  Administered 2015-04-16 (×2): 10 mg via INTRAVENOUS
  Administered 2015-04-16: 30 mg via INTRAVENOUS
  Administered 2015-04-16: 10 mg via INTRAVENOUS

## 2015-04-16 MED ORDER — ONDANSETRON HCL 4 MG/2ML IJ SOLN
4.0000 mg | Freq: Four times a day (QID) | INTRAMUSCULAR | Status: DC | PRN
  Administered 2015-04-17: 4 mg via INTRAVENOUS

## 2015-04-16 MED ORDER — ONDANSETRON HCL 4 MG/2ML IJ SOLN
INTRAMUSCULAR | Status: AC
  Filled 2015-04-16: qty 2

## 2015-04-16 MED ORDER — ONDANSETRON HCL 4 MG/2ML IJ SOLN
4.0000 mg | Freq: Four times a day (QID) | INTRAMUSCULAR | Status: DC | PRN
  Filled 2015-04-16: qty 2

## 2015-04-16 MED ORDER — DIPHENHYDRAMINE HCL 12.5 MG/5ML PO ELIX: 12.5000 mg | ORAL_SOLUTION | Freq: Four times a day (QID) | ORAL | Status: DC | PRN

## 2015-04-16 MED ORDER — SCOPOLAMINE 1 MG/3DAYS TD PT72
MEDICATED_PATCH | TRANSDERMAL | Status: DC | PRN
  Administered 2015-04-16: 1 via TRANSDERMAL

## 2015-04-16 MED ORDER — LIDOCAINE HCL (CARDIAC) 20 MG/ML IV SOLN
INTRAVENOUS | Status: AC
  Filled 2015-04-16: qty 5

## 2015-04-16 MED ORDER — NALOXONE HCL 0.4 MG/ML IJ SOLN
0.4000 mg | INTRAMUSCULAR | Status: DC | PRN
Start: 2015-04-16 — End: 2015-04-17

## 2015-04-16 MED ORDER — MIDAZOLAM HCL 5 MG/5ML IJ SOLN
INTRAMUSCULAR | Status: DC | PRN
  Administered 2015-04-16: 2 mg via INTRAVENOUS

## 2015-04-16 MED ORDER — ONDANSETRON HCL 4 MG/2ML IJ SOLN
4.0000 mg | Freq: Once | INTRAMUSCULAR | Status: AC | PRN
  Administered 2015-04-16: 4 mg via INTRAVENOUS
  Filled 2015-04-16: qty 2

## 2015-04-16 MED ORDER — OXYCODONE HCL 5 MG PO TABS
5.0000 mg | ORAL_TABLET | Freq: Once | ORAL | Status: DC | PRN
  Filled 2015-04-16: qty 1

## 2015-04-16 MED ORDER — ONDANSETRON HCL 4 MG PO TABS: 4.0000 mg | ORAL_TABLET | Freq: Four times a day (QID) | ORAL | Status: DC | PRN

## 2015-04-16 MED ORDER — SCOPOLAMINE 1 MG/3DAYS TD PT72
MEDICATED_PATCH | TRANSDERMAL | Status: AC
  Filled 2015-04-16: qty 1

## 2015-04-16 MED ORDER — LIDOCAINE HCL (CARDIAC) 20 MG/ML IV SOLN
INTRAVENOUS | Status: DC | PRN
  Administered 2015-04-16: 100 mg via INTRAVENOUS

## 2015-04-16 MED ORDER — CEFOTETAN DISODIUM-DEXTROSE 2-2.08 GM-% IV SOLR
INTRAVENOUS | Status: AC
  Filled 2015-04-16: qty 50

## 2015-04-16 MED ORDER — DEXTROSE 5 % IV SOLN
2.0000 g | INTRAVENOUS | Status: AC
  Administered 2015-04-16: 2 g via INTRAVENOUS
  Filled 2015-04-16: qty 2

## 2015-04-16 MED ORDER — MIDAZOLAM HCL 2 MG/2ML IJ SOLN
INTRAMUSCULAR | Status: AC
  Filled 2015-04-16: qty 2

## 2015-04-16 MED ORDER — SUCCINYLCHOLINE CHLORIDE 20 MG/ML IJ SOLN
INTRAMUSCULAR | Status: DC | PRN
  Administered 2015-04-16: 100 mg via INTRAVENOUS

## 2015-04-16 SURGICAL SUPPLY — 85 items
BAG URINE DRAINAGE (UROLOGICAL SUPPLIES) ×2 IMPLANT
BLADE SURG 10 STRL SS (BLADE) ×3 IMPLANT
BLADE SURG 11 STRL SS (BLADE) ×2 IMPLANT
BLADE SURG 15 STRL LF DISP TIS (BLADE) IMPLANT
BLADE SURG 15 STRL SS (BLADE) ×2
CATH ROBINSON RED A/P 16FR (CATHETERS) ×2 IMPLANT
CLEANER CAUTERY TIP 5X5 PAD (MISCELLANEOUS) ×1 IMPLANT
CONT SPEC 4OZ CLIKSEAL STRL BL (MISCELLANEOUS) ×1 IMPLANT
COVER BACK TABLE 60X90IN (DRAPES) ×4 IMPLANT
COVER MAYO STAND STRL (DRAPES) ×4 IMPLANT
DRAPE LG THREE QUARTER DISP (DRAPES) ×2 IMPLANT
DRAPE UNDERBUTTOCKS STRL (DRAPE) ×2 IMPLANT
DRAPE WARM FLUID 44X44 (DRAPE) ×1 IMPLANT
DRSG OPSITE POSTOP 3X4 (GAUZE/BANDAGES/DRESSINGS) IMPLANT
DRSG OPSITE POSTOP 4X10 (GAUZE/BANDAGES/DRESSINGS) ×1 IMPLANT
DRSG TEGADERM 2-3/8X2-3/4 SM (GAUZE/BANDAGES/DRESSINGS) IMPLANT
ELECT BLADE 6.5 .24CM SHAFT (ELECTRODE) ×1 IMPLANT
ELECT LIGASURE LONG (ELECTRODE) IMPLANT
ELECT LIGASURE SHORT 9 REUSE (ELECTRODE) ×2 IMPLANT
ELECT REM PT RETURN 9FT ADLT (ELECTROSURGICAL) ×2
ELECTRODE REM PT RTRN 9FT ADLT (ELECTROSURGICAL) ×1 IMPLANT
GLOVE BIO SURGEON STRL SZ 6.5 (GLOVE) ×2 IMPLANT
GLOVE BIO SURGEON STRL SZ7 (GLOVE) ×6 IMPLANT
GLOVE BIOGEL M 6.5 STRL (GLOVE) ×1 IMPLANT
GLOVE BIOGEL PI IND STRL 6.5 (GLOVE) IMPLANT
GLOVE BIOGEL PI IND STRL 7.5 (GLOVE) IMPLANT
GLOVE BIOGEL PI INDICATOR 6.5 (GLOVE) ×2
GLOVE BIOGEL PI INDICATOR 7.5 (GLOVE) ×1
GOWN STRL REUS W/ TWL LRG LVL3 (GOWN DISPOSABLE) ×4 IMPLANT
GOWN STRL REUS W/TWL LRG LVL3 (GOWN DISPOSABLE) ×8
HOLDER FOLEY CATH W/STRAP (MISCELLANEOUS) ×2 IMPLANT
IV LACTATED RINGER IRRG 3000ML (IV SOLUTION) ×2
IV LR IRRIG 3000ML ARTHROMATIC (IV SOLUTION) IMPLANT
KIT ROOM TURNOVER WOR (KITS) ×2 IMPLANT
LIQUID BAND (GAUZE/BANDAGES/DRESSINGS) ×3 IMPLANT
MANIFOLD NEPTUNE II (INSTRUMENTS) IMPLANT
NDL HYPO 25X1 1.5 SAFETY (NEEDLE) ×1 IMPLANT
NDL INSUFFLATION 14GA 120MM (NEEDLE) ×1 IMPLANT
NDL SPNL 22GX3.5 QUINCKE BK (NEEDLE) IMPLANT
NEEDLE HYPO 25X1 1.5 SAFETY (NEEDLE) ×2 IMPLANT
NEEDLE INSUFFLATION 14GA 120MM (NEEDLE) ×2 IMPLANT
NEEDLE SPNL 22GX3.5 QUINCKE BK (NEEDLE) ×2 IMPLANT
NS IRRIG 500ML POUR BTL (IV SOLUTION) ×4 IMPLANT
PACK BASIN DAY SURGERY FS (CUSTOM PROCEDURE TRAY) ×2 IMPLANT
PACKING VAGINAL (PACKING) IMPLANT
PAD CLEANER CAUTERY TIP 5X5 (MISCELLANEOUS) ×1
PAD OB MATERNITY 4.3X12.25 (PERSONAL CARE ITEMS) ×2 IMPLANT
PADDING ION DISPOSABLE (MISCELLANEOUS) ×2 IMPLANT
PENCIL BUTTON HOLSTER BLD 10FT (ELECTRODE) ×2 IMPLANT
SEALER TISSUE G2 CVD JAW 45CM (ENDOMECHANICALS) ×2 IMPLANT
SET IRRIG TUBING LAPAROSCOPIC (IRRIGATION / IRRIGATOR) IMPLANT
SHEET LAVH (DRAPES) ×2 IMPLANT
SOLUTION ANTI FOG 6CC (MISCELLANEOUS) ×2 IMPLANT
SOLUTION ELECTROLUBE (MISCELLANEOUS) IMPLANT
SPONGE GAUZE 2X2 8PLY STRL LF (GAUZE/BANDAGES/DRESSINGS) IMPLANT
SPONGE GAUZE 4X4 12PLY STER LF (GAUZE/BANDAGES/DRESSINGS) IMPLANT
SPONGE LAP 18X18 X RAY DECT (DISPOSABLE) ×3 IMPLANT
SPONGE LAP 4X18 X RAY DECT (DISPOSABLE) ×2 IMPLANT
STRIP CLOSURE SKIN 1/2X4 (GAUZE/BANDAGES/DRESSINGS) ×2 IMPLANT
STRIP CLOSURE SKIN 1/4X3 (GAUZE/BANDAGES/DRESSINGS) IMPLANT
SUT MNCRL AB 4-0 PS2 18 (SUTURE) ×1 IMPLANT
SUT MON AB 2-0 CT1 36 (SUTURE) ×4 IMPLANT
SUT PDS AB 0 CT1 36 (SUTURE) ×2 IMPLANT
SUT VIC AB 0 CT1 18XCR BRD8 (SUTURE) ×3 IMPLANT
SUT VIC AB 0 CT1 36 (SUTURE) IMPLANT
SUT VIC AB 0 CT1 8-18 (SUTURE) ×4
SUT VIC AB 2-0 CT1 (SUTURE) ×1 IMPLANT
SUT VIC AB 2-0 UR6 27 (SUTURE) IMPLANT
SUT VICRYL 0 TIES 12 18 (SUTURE) ×2 IMPLANT
SUT VICRYL 0 UR6 27IN ABS (SUTURE) IMPLANT
SUT VICRYL 4-0 PS2 18IN ABS (SUTURE) ×2 IMPLANT
SYR BULB IRRIGATION 50ML (SYRINGE) ×2 IMPLANT
SYR CONTROL 10ML LL (SYRINGE) ×2 IMPLANT
SYRINGE 10CC LL (SYRINGE) ×4 IMPLANT
TOWEL OR 17X24 6PK STRL BLUE (TOWEL DISPOSABLE) ×4 IMPLANT
TRAY DSU PREP LF (CUSTOM PROCEDURE TRAY) ×2 IMPLANT
TRAY FOLEY CATH SILVER 14FR (SET/KITS/TRAYS/PACK) ×2 IMPLANT
TROCAR OPTI TIP 5M 100M (ENDOMECHANICALS) ×2 IMPLANT
TROCAR XCEL DIL TIP R 11M (ENDOMECHANICALS) ×2 IMPLANT
TUBE CONNECTING 12X1/4 (SUCTIONS) ×5 IMPLANT
TUBING INSUFFLATION 10FT LAP (TUBING) ×2 IMPLANT
TUBING SUCTION BULK 100 FT (MISCELLANEOUS) IMPLANT
WARMER LAPAROSCOPE (MISCELLANEOUS) ×2 IMPLANT
WATER STERILE IRR 500ML POUR (IV SOLUTION) ×2 IMPLANT
YANKAUER SUCT BULB TIP NO VENT (SUCTIONS) ×2 IMPLANT

## 2015-04-16 NOTE — Progress Notes (Signed)
The patient was re-examined with no change in status 

## 2015-04-16 NOTE — Transfer of Care (Signed)
Immediate Anesthesia Transfer of Care Note  Patient: Kristen Hines  Procedure(s) Performed: Procedure(s) (LRB):  ATTEMPTED LAPAROSCOPIC ASSISTED VAGINAL HYSTERECTOMY WITH BILATERAL SALPINGECTOMY (Bilateral) HYSTERECTOMY ABDOMINAL WITH BILATERAL SALPINGECTOMY (Bilateral)  Patient Location: PACU  Anesthesia Type: General  Level of Consciousness: awake, oriented, sedated and patient cooperative  Airway & Oxygen Therapy: Patient Spontanous Breathing and Patient connected to face mask oxygen  Post-op Assessment: Report given to PACU RN and Post -op Vital signs reviewed and stable  Post vital signs: Reviewed and stable  Complications: No apparent anesthesia complications

## 2015-04-16 NOTE — Op Note (Signed)
Preoperative diagnosis: Leiomyoma, menorrhagia, failed endometrial ablation  Postoperative diagnosis: Same plus omental adhesions, pelvic adhesions  Procedure: Agnostic laparoscopy followed by laparotomy with TAH bilateral salpingectomy, lysis of adhesions  Surgeon: Matthew Saras  Asst.: McComb  EBL: 200 cc  Specimens removed: Uterus, bilateral tubes, to pathology  Procedure and findings:  Patient taken the operating room after an adequate level of general anesthesia was obtained the patient's leg stirrups the abdomen perineum and vagina were prepped and draped in usual fashion bladder was drained EUA carried out uterus is upper limit normal size, mid position, mobile, Hulka tenaculum was positioned. Attention directed to the abdomen where 2 cm subumbilical incision was made after infiltrating with quarter percent Marcaine plain the varies needle was then introduced without difficulty, its intra-abdominal position was verified by pressure water testing. After 2-1/2 L pneumoperitoneum syncopated, lap scopic trocar and sleeve were then introduced that difficulty. There was no evidence of any bleeding or trauma. However, there were some omental adhesions into the anterior abdominal wall, there was no bowel adherent into that area, the scope could be swung around the blanket of omental adhesions to be the pelvis however there were some significant cul-de-sac adhesions and adhesions surrounding both tubes and some in the anterior bladder flap area also. Decision made to proceed with TAH.  Scope was removed the umbilical incision closed with a 4-0 Monocryl subcuticular suture. Foley catheter was placed, legs were placed more supine. Pfannenstiel incision was made 2 finger breaths above the symphysis carried down to the fascia which was incised and extended transversely. Rectus muscles divided in the midline, peritoneum entered superiorly without incident and extended in a vertical fashion. The previously noted  omental adhesions were above the incision and were left alone temporarily. O'Connor-O'Sullivan retractor was positioned bowel Speck sparely out of the field the uterus itself was enlarged 9-10 week size there was some significant scarring in the cul-de-sac area below the cervical vaginal junction although no active endometriosis was noted. Anterior bladder flap and periadnexal adhesions were lysed in an avascular plane 20 Filshie clips had actually popped off the tube and were removed.  Starting on the left the round ligament was clamped divided and suture ligated with 0 Vicryl the same repeated on the opposite side peritoneum carried around to midline the bladder was dissected below the cervical vaginal margin with sharp and blunt dissection. Posterior leaf of the broad ligament on the left was perforated with the surgeon's finger the left utero-ovarian pedicles clamped divided first free tie followed by suture ligature of Vicryl. The exact same repeated on the opposite side thus conserving both ovaries which were otherwise normal. This point bilateral salpingectomy was carried out with a clamp cut and tie technique both tubes were removed.  In sequential manner staying close to the uterus the uterine basket or pedicle, cardinal ligament and cervical vaginal pedicles were clamped divided and suture ligated. Vaginal cuff was then closed with interrupted 0 Vicryl sutures after the specimen was excised the pelvis is irrigated with saline and noted be hemostatic due to some scarring in the cul-de-sac area the possibility of a small endometriotic nodule, postoperative colonoscopy evaluation will be scheduled.  Prior to closure sponge count, needle, history counts reported as correct 2. Peritoneum closed with a running 2-0 Vicryl suture. The same for the rectus muscles. 0 PDS for the fascia diluted Exparel solution 1 amp in 100 cc was injected into the subfascial and subcutaneous tissue for postoperative analgesia.  Subcutaneous was then irrigated noted be hemostatic  4-0 Monocryl subcuticular closure with a honeycomb dressing. She tolerated this well went to recovery room in good condition.  Dictated with PorterD.

## 2015-04-16 NOTE — Anesthesia Procedure Notes (Signed)
Procedure Name: Intubation Date/Time: 04/16/2015 7:28 AM Performed by: Denna Haggard D Pre-anesthesia Checklist: Patient identified, Emergency Drugs available, Suction available and Patient being monitored Patient Re-evaluated:Patient Re-evaluated prior to inductionOxygen Delivery Method: Circle System Utilized Preoxygenation: Pre-oxygenation with 100% oxygen Intubation Type: IV induction Ventilation: Mask ventilation without difficulty Laryngoscope Size: Mac and 3 Grade View: Grade I Tube type: Oral Tube size: 7.0 mm Number of attempts: 1 Airway Equipment and Method: Stylet and Oral airway Placement Confirmation: ETT inserted through vocal cords under direct vision,  positive ETCO2 and breath sounds checked- equal and bilateral Secured at: 22 cm Tube secured with: Tape Dental Injury: Teeth and Oropharynx as per pre-operative assessment

## 2015-04-16 NOTE — Anesthesia Postprocedure Evaluation (Signed)
Anesthesia Post Note  Patient: Kristen Hines  Procedure(s) Performed: Procedure(s) (LRB):  ATTEMPTED LAPAROSCOPIC ASSISTED VAGINAL HYSTERECTOMY WITH BILATERAL SALPINGECTOMY (Bilateral) HYSTERECTOMY ABDOMINAL WITH BILATERAL SALPINGECTOMY (Bilateral)  Patient location during evaluation: PACU Anesthesia Type: General Level of consciousness: awake and alert Pain management: pain level controlled Vital Signs Assessment: post-procedure vital signs reviewed and stable Respiratory status: spontaneous breathing, nonlabored ventilation, respiratory function stable and patient connected to nasal cannula oxygen Cardiovascular status: blood pressure returned to baseline and stable Postop Assessment: no signs of nausea or vomiting Anesthetic complications: no    Last Vitals:  Filed Vitals:   04/16/15 0611 04/16/15 0916  BP: 150/92 162/92  Pulse: 77 62  Temp: 37 C 36.9 C  Resp: 16 20    Last Pain: There were no vitals filed for this visit.               Tika Hannis JENNETTE

## 2015-04-17 ENCOUNTER — Encounter (HOSPITAL_BASED_OUTPATIENT_CLINIC_OR_DEPARTMENT_OTHER): Payer: Self-pay | Admitting: Obstetrics and Gynecology

## 2015-04-17 LAB — CBC
HEMATOCRIT: 32.6 % — AB (ref 36.0–46.0)
HEMOGLOBIN: 10.3 g/dL — AB (ref 12.0–15.0)
MCH: 26.4 pg (ref 26.0–34.0)
MCHC: 31.6 g/dL (ref 30.0–36.0)
MCV: 83.6 fL (ref 78.0–100.0)
Platelets: 231 10*3/uL (ref 150–400)
RBC: 3.9 MIL/uL (ref 3.87–5.11)
RDW: 13.9 % (ref 11.5–15.5)
WBC: 10.3 10*3/uL (ref 4.0–10.5)

## 2015-04-17 LAB — POCT PREGNANCY, URINE: Preg Test, Ur: NEGATIVE

## 2015-04-17 NOTE — Progress Notes (Signed)
1 Day Post-Op Procedure(s) (LRB):  ATTEMPTED LAPAROSCOPIC ASSISTED VAGINAL HYSTERECTOMY WITH BILATERAL SALPINGECTOMY (Bilateral) HYSTERECTOMY ABDOMINAL WITH BILATERAL SALPINGECTOMY (Bilateral)  Subjective: Patient reports tolerating PO.    Objective: I have reviewed patient's vital signs, medications and labs. CBC    Component Value Date/Time   WBC 10.3 04/17/2015 0405   WBC 4.6 05/12/2012 1347   RBC 3.90 04/17/2015 0405   RBC 4.46 05/12/2012 1347   HGB 10.3* 04/17/2015 0405   HGB 11.3* 05/12/2012 1347   HCT 32.6* 04/17/2015 0405   HCT 36.4* 05/12/2012 1347   PLT 231 04/17/2015 0405   MCV 83.6 04/17/2015 0405   MCV 81.6 05/12/2012 1347   MCH 26.4 04/17/2015 0405   MCH 25.3* 05/12/2012 1347   MCHC 31.6 04/17/2015 0405   MCHC 31.0* 05/12/2012 1347   RDW 13.9 04/17/2015 0405      abd soft, bandage C/D  Assessment: s/p Procedure(s):  ATTEMPTED LAPAROSCOPIC ASSISTED VAGINAL HYSTERECTOMY WITH BILATERAL SALPINGECTOMY (Bilateral) HYSTERECTOMY ABDOMINAL WITH BILATERAL SALPINGECTOMY (Bilateral): stable  Plan: Advance diet Encourage ambulation Advance to PO medication  LOS: 1 day    Kristen Hines 04/17/2015, 8:49 AM

## 2015-04-18 MED ORDER — OXYCODONE-ACETAMINOPHEN 5-325 MG PO TABS: 1.0000 | ORAL_TABLET | ORAL | Status: DC | PRN

## 2015-04-18 MED ORDER — IBUPROFEN 800 MG PO TABS: 800.0000 mg | ORAL_TABLET | Freq: Three times a day (TID) | ORAL | Status: DC | PRN

## 2015-04-18 NOTE — Discharge Summary (Signed)
Physician Discharge Summary  Patient ID: KALESE HARJU MRN: [id number] DOB/AGE: 1981-02-26 35 y.o.  Admit date: 04/16/2015 Discharge date: 04/18/2015  Admission Diagnoses:pelvic pain, leiomyoma, AUB Discharge Diagnoses: same, pelvic adhesions Active Problems:   Abnormal uterine bleeding   Discharged Condition: good  Hospital Course: adm for LAVH, converted to TAH + bilat salpingectomy for pelvic adhesions, on POD 1, diet advanced>>>ambulating, on POD 2 was afeb, tol PO and ready for DC  Consults: None  Significant Diagnostic Studies: labs:  CBC    Component Value Date/Time   WBC 10.3 04/17/2015 0405   WBC 4.6 05/12/2012 1347   RBC 3.90 04/17/2015 0405   RBC 4.46 05/12/2012 1347   HGB 10.3* 04/17/2015 0405   HGB 11.3* 05/12/2012 1347   HCT 32.6* 04/17/2015 0405   HCT 36.4* 05/12/2012 1347   PLT 231 04/17/2015 0405   MCV 83.6 04/17/2015 0405   MCV 81.6 05/12/2012 1347   MCH 26.4 04/17/2015 0405   MCH 25.3* 05/12/2012 1347   MCHC 31.6 04/17/2015 0405   MCHC 31.0* 05/12/2012 1347   RDW 13.9 04/17/2015 0405      Treatments: surgery: TAH, bilat salpingectomy  Discharge Exam: Blood pressure 117/66, pulse 70, temperature 99 F (37.2 C), temperature source Oral, resp. rate 16, height 5\' 6"  (1.676 m), weight 229 lb 8 oz (104.101 kg), last menstrual period 04/02/2015, SpO2 100 %. Incision/Wound:bandage C/D  Disposition:      Medication List    TAKE these medications        ibuprofen 800 MG tablet  Commonly known as:  ADVIL,MOTRIN  Take 1 tablet (800 mg total) by mouth every 8 (eight) hours as needed for moderate pain (mild pain).     oxyCODONE-acetaminophen 5-325 MG tablet  Commonly known as:  PERCOCET/ROXICET  Take 1-2 tablets by mouth every 4 (four) hours as needed for severe pain (moderate to severe pain (when tolerating fluids)).           Follow-up Information    Follow up with Margarette Asal, MD. Schedule an appointment as soon as possible for a  visit in 1 week.   Specialty:  Obstetrics and Gynecology   Contact information:   [address] [place] [address] [phone]       Signed: Margarette Asal 04/18/2015, 7:31 AM

## 2015-06-14 ENCOUNTER — Other Ambulatory Visit: Payer: Self-pay | Admitting: Gastroenterology

## 2015-09-02 ENCOUNTER — Other Ambulatory Visit: Payer: Self-pay | Admitting: Obstetrics and Gynecology

## 2015-09-02 DIAGNOSIS — N644 Mastodynia: Secondary | ICD-10-CM

## 2015-09-02 DIAGNOSIS — N6320 Unspecified lump in the left breast, unspecified quadrant: Secondary | ICD-10-CM

## 2015-09-06 ENCOUNTER — Other Ambulatory Visit: Payer: Self-pay | Admitting: Obstetrics and Gynecology

## 2015-09-06 ENCOUNTER — Ambulatory Visit
Admission: RE | Admit: 2015-09-06 | Discharge: 2015-09-06 | Disposition: A | Discharge status: Home / Self Care | Payer: 59 | Source: Ambulatory Visit | Attending: Obstetrics and Gynecology | Admitting: Obstetrics and Gynecology

## 2015-09-06 DIAGNOSIS — N644 Mastodynia: Secondary | ICD-10-CM

## 2015-09-06 DIAGNOSIS — N6320 Unspecified lump in the left breast, unspecified quadrant: Secondary | ICD-10-CM

## 2016-10-05 ENCOUNTER — Ambulatory Visit (INDEPENDENT_AMBULATORY_CARE_PROVIDER_SITE_OTHER): Payer: 59 | Admitting: Physician Assistant

## 2016-10-05 ENCOUNTER — Encounter: Payer: Self-pay | Admitting: Physician Assistant

## 2016-10-05 VITALS — BP 149/92 | HR 57 | Temp 97.9°F | Resp 16 | Ht 66.0 in | Wt 246.0 lb

## 2016-10-05 DIAGNOSIS — Z113 Encounter for screening for infections with a predominantly sexual mode of transmission: Secondary | ICD-10-CM

## 2016-10-05 DIAGNOSIS — N898 Other specified noninflammatory disorders of vagina: Secondary | ICD-10-CM | POA: Diagnosis not present

## 2016-10-05 LAB — POCT WET + KOH PREP
Trich by wet prep: ABSENT
Yeast by KOH: ABSENT
Yeast by wet prep: ABSENT

## 2016-10-05 MED ORDER — FLUCONAZOLE 150 MG PO TABS: 150.0000 mg | ORAL_TABLET | Freq: Once | ORAL | 0 refills | Status: AC

## 2016-10-05 MED ORDER — FLUCONAZOLE 150 MG PO TABS: 150.0000 mg | ORAL_TABLET | Freq: Once | ORAL | 0 refills | Status: DC

## 2016-10-05 MED ORDER — NYSTATIN-TRIAMCINOLONE 100000-0.1 UNIT/GM-% EX OINT: 1.0000 "application " | TOPICAL_OINTMENT | Freq: Two times a day (BID) | CUTANEOUS | 0 refills | Status: DC

## 2016-10-05 NOTE — Progress Notes (Signed)
0

## 2016-10-05 NOTE — Progress Notes (Signed)
PRIMARY CARE AT [place], [place] [address] [phone]  Date:  10/05/2016   Name:  Kristen Hines   DOB:  04/15/1980   MRN:  [id number]  PCP:  Patient, No Pcp Per    History of Present Illness:  Kristen Hines is a 36 y.o. female patient who presents to PCP with  Chief Complaint  Patient presents with  . Vaginal Itching    x2 days would like STI testing     Vaginal itching for 2 days without discharge.  No rash that she can detect.  She denies any abnormal dysuria, hematuria, or frequency.  She has tried [employer] which has not helped much. She is sexually active with one partner.   Patient Active Problem List   Diagnosis Date Noted  . Abnormal uterine bleeding 04/16/2015    Past Medical History:  Diagnosis Date  . Abnormal uterine bleeding (AUB)   . Dry cough   . Heart murmur    per pt mild asymptomatic  . Leiomyoma of uterus     Past Surgical History:  Procedure Laterality Date  . ABDOMINAL HYSTERECTOMY Bilateral 04/16/2015   Procedure: HYSTERECTOMY ABDOMINAL WITH BILATERAL SALPINGECTOMY;  Surgeon: Molli Posey, MD;  Location: [facility];  Service: Gynecology;  Laterality: Bilateral;  . LAPAROSCOPIC VAGINAL HYSTERECTOMY WITH SALPINGECTOMY Bilateral 04/16/2015   Procedure:  ATTEMPTED LAPAROSCOPIC ASSISTED VAGINAL HYSTERECTOMY WITH BILATERAL SALPINGECTOMY;  Surgeon: Molli Posey, MD;  Location: [facility];  Service: Gynecology;  Laterality: Bilateral;  . LAPAROSCOPY BILATERAL TUBAL WITH FILCHIE CLIPS/   D & C HYSTEROSCOPY ENDOMETRIAL NOVASURE ABLATION  03-01-2009    Social History  Substance Use Topics  . Smoking status: Never Smoker  . Smokeless tobacco: Never Used  . Alcohol use Yes     Comment: SOCIAL    Family History  Problem Relation Age of Onset  . Hypertension Mother   . Breast cancer Mother   . Hypertension Father   . Heart disease Father   . Diabetes Father     No Known  Allergies  Medication list has been reviewed and updated.  Current Outpatient Prescriptions on File Prior to Visit  Medication Sig Dispense Refill  . ibuprofen (ADVIL,MOTRIN) 800 MG tablet Take 1 tablet (800 mg total) by mouth every 8 (eight) hours as needed for moderate pain (mild pain). (Patient not taking: Reported on 10/05/2016) 30 tablet 1  . oxyCODONE-acetaminophen (PERCOCET/ROXICET) 5-325 MG tablet Take 1-2 tablets by mouth every 4 (four) hours as needed for severe pain (moderate to severe pain (when tolerating fluids)). (Patient not taking: Reported on 10/05/2016) 30 tablet 0   No current facility-administered medications on file prior to visit.     ROS ROS otherwise unremarkable unless listed above.  Physical Examination: BP (!) 149/92 (BP Location: Right Arm, Patient Position: Sitting, Cuff Size: Large)   Pulse (!) 57   Temp 97.9 F (36.6 C) (Oral)   Resp 16   Ht 5\' 6"  (1.676 m)   Wt 246 lb (111.6 kg)   LMP 04/02/2015 (Exact Date)   SpO2 99%   BMI 39.71 kg/m  Ideal Body Weight: Weight in (lb) to have BMI = 25: 154.6  Physical Exam  Constitutional: She is oriented to person, place, and time. She appears well-developed and well-nourished. No distress.  HENT:  Head: Normocephalic and atraumatic.  Right Ear: External ear normal.  Left Ear: External ear normal.  Eyes: Pupils are equal, round, and reactive to light. Conjunctivae and EOM are  normal.  Cardiovascular: Normal rate.   Pulmonary/Chest: Effort normal. No respiratory distress.  Genitourinary: Pelvic exam was performed with patient supine. Cervix exhibits no motion tenderness, no discharge and no friability.  Genitourinary Comments: Vaginal discharge cream.  Neurological: She is alert and oriented to person, place, and time.  Skin: She is not diaphoretic.  Psychiatric: She has a normal mood and affect. Her behavior is normal.   Results for orders placed or performed in visit on 10/05/16  HIV antibody  Result  Value Ref Range   HIV Screen 4th Generation wRfx Non Reactive Non Reactive  RPR  Result Value Ref Range   RPR Ser Ql Non Reactive Non Reactive  POCT Wet + KOH Prep  Result Value Ref Range   Yeast by KOH Absent Absent   Yeast by wet prep Absent Absent   WBC by wet prep Few Few   Clue Cells Wet Prep HPF POC None None   Trich by wet prep Absent Absent   Bacteria Wet Prep HPF POC Many (A) Few   Epithelial Cells By [employer] Pref ([facility]) Few None, Few, Too numerous to count   RBC,UR,HPF,POC None None RBC/hpf     Assessment and Plan: [name] is a 36 y.o. female who is here today for cc of vaginal pruritus Std screening as requested. We will try the tablet to treat for possible yeast infection.   Vaginal discharge - Plan: POCT Wet + KOH Prep, fluconazole (DIFLUCAN) 150 MG tablet, DISCONTINUED: fluconazole (DIFLUCAN) 150 MG tablet, DISCONTINUED: fluconazole (DIFLUCAN) 150 MG tablet  Vaginal irritation - Plan: GC/Chlamydia Probe Amp, fluconazole (DIFLUCAN) 150 MG tablet, nystatin-triamcinolone ointment (MYCOLOG), DISCONTINUED: fluconazole (DIFLUCAN) 150 MG tablet, DISCONTINUED: nystatin-triamcinolone ointment (MYCOLOG), DISCONTINUED: fluconazole (DIFLUCAN) 150 MG tablet, DISCONTINUED: nystatin-triamcinolone ointment (MYCOLOG)  Screening for STD (sexually transmitted disease) - Plan: HIV antibody, RPR, GC/Chlamydia Probe Amp  Ivar Drape, PA-C [facility] 7/18/20188:33 AM

## 2016-10-05 NOTE — Patient Instructions (Addendum)
Please apply the cream to the outside of the vaginal area.  Try one diflucan.   Rinse the vaginal area with warm water.   Please return with the urine.  I will contact you regarding those results.   IF you received an x-ray today, you will receive an invoice from [facility]. Please contact [facility] at [phone] with questions or concerns regarding your invoice.   IF you received labwork today, you will receive an invoice from [facility]. Please contact [facility] at [phone] with questions or concerns regarding your invoice.   Our billing staff will not be able to assist you with questions regarding bills from these companies.  You will be contacted with the lab results as soon as they are available. The fastest way to get your results is to activate your My Chart account. Instructions are located on the last page of this paperwork. If you have not heard from [place] regarding the results in 2 weeks, please contact this office.

## 2016-10-06 ENCOUNTER — Telehealth: Payer: Self-pay | Admitting: Physician Assistant

## 2016-10-06 LAB — RPR: RPR Ser Ql: NONREACTIVE

## 2016-10-06 LAB — HIV ANTIBODY (ROUTINE TESTING W REFLEX): HIV Screen 4th Generation wRfx: NONREACTIVE

## 2016-10-06 NOTE — Telephone Encounter (Signed)
I just spoke with the pharmacist, they will run the cream through her insurance instead of the ointment for approval. If not, authorized separating them if needed.

## 2016-10-06 NOTE — Telephone Encounter (Signed)
Pt called in saying that the pharmacy told her that [facility] [place] has to send in a separate rx for the ointment that was prescribed for the pt because its a double pack & told her it needs to be prescribed separately & they sent over a fax. They were not able to give her the full doses of the ointment.   Please advise

## 2016-10-09 NOTE — Telephone Encounter (Signed)
Can you please find out what is the delay?

## 2016-10-09 NOTE — Telephone Encounter (Signed)
I ordered the gonorrhea chlamydia cervical swab at the visit.  Can we make sure this is done and sent out.

## 2016-11-03 ENCOUNTER — Telehealth: Payer: Self-pay | Admitting: Physician Assistant

## 2016-11-03 NOTE — Telephone Encounter (Signed)
PATIENT STATES SHE SAW STEPHANIE ENGLISH LAST MONTH. SHE GAVE A URINE SAMPLE THAT DAY, BUT STEPHANIE ORDERED HER TO RETURN AND GIVE A SECOND SAMPLE. SHE SAID SHE CAME BACK A FEW DAYS LATER AND GAVE ONE. SHE HAS NEVER RECEIVED THE RESULTS. BEST PHONE [phone] (CELL) PHARMACY CHOICE IF NEEDED IS [facility] ON [address]. [place]

## 2016-11-06 NOTE — Telephone Encounter (Signed)
Please advise 

## 2016-11-11 NOTE — Telephone Encounter (Signed)
Please advise patient to return for gonorrhea chlamydia recollection.  This was not processed, though ordered.

## 2016-11-12 NOTE — Telephone Encounter (Signed)
Patient advised to return to the clinic for recollection of urine specimen. Verbalized understanding and will return tomorrow or next week.

## 2016-11-12 NOTE — Telephone Encounter (Signed)
Left message to return to clinic for lab only visit.

## 2017-06-25 ENCOUNTER — Encounter: Payer: Self-pay | Admitting: Genetic Counselor

## 2017-06-28 ENCOUNTER — Other Ambulatory Visit: Payer: Self-pay | Admitting: Obstetrics and Gynecology

## 2017-06-28 DIAGNOSIS — Z803 Family history of malignant neoplasm of breast: Secondary | ICD-10-CM

## 2017-06-30 ENCOUNTER — Encounter: Payer: Self-pay | Admitting: Physician Assistant

## 2017-07-04 ENCOUNTER — Ambulatory Visit
Admission: RE | Admit: 2017-07-04 | Discharge: 2017-07-04 | Disposition: A | Discharge status: Home / Self Care | Payer: 59 | Source: Ambulatory Visit | Attending: Obstetrics and Gynecology | Admitting: Obstetrics and Gynecology

## 2017-07-04 DIAGNOSIS — Z803 Family history of malignant neoplasm of breast: Secondary | ICD-10-CM

## 2017-07-04 MED ORDER — GADOBENATE DIMEGLUMINE 529 MG/ML IV SOLN
20.0000 mL | Freq: Once | INTRAVENOUS | Status: AC | PRN
  Administered 2017-07-04: 20 mL via INTRAVENOUS

## 2017-07-08 ENCOUNTER — Encounter: Payer: Self-pay | Admitting: Genetic Counselor

## 2017-07-08 ENCOUNTER — Inpatient Hospital Stay: Payer: 59 | Attending: Genetic Counselor | Admitting: Genetic Counselor

## 2017-07-08 ENCOUNTER — Inpatient Hospital Stay: Payer: 59

## 2017-07-08 DIAGNOSIS — Z8042 Family history of malignant neoplasm of prostate: Secondary | ICD-10-CM | POA: Diagnosis not present

## 2017-07-08 DIAGNOSIS — Z803 Family history of malignant neoplasm of breast: Secondary | ICD-10-CM | POA: Diagnosis not present

## 2017-07-08 DIAGNOSIS — Z800 Family history of malignant neoplasm of digestive organs: Secondary | ICD-10-CM | POA: Insufficient documentation

## 2017-07-08 DIAGNOSIS — Z315 Encounter for genetic counseling: Secondary | ICD-10-CM

## 2017-07-08 NOTE — Progress Notes (Addendum)
[facility]      Initial Visit   Patient Name: Kristen Hines Patient DOB: 26-Oct-1980 Patient Age: 37 y.o. Encounter Date: 07/08/2017  Referring Provider: Molli Posey, MD   Reason for Visit: Evaluate for hereditary susceptibility to cancer    Assessment and Plan:  . Kristen Hines's history is not highly suggestive of a hereditary predisposition to cancer, but given that her mother had breast cancer in her 51s and she has a maternal aunt with pancreatic cancer, a genetics evaluation is indicated.   . Testing is recommended to determine whether she has a pathogenic mutation that will impact her screening and risk-reduction for cancer. A negative result will be reassuring, but we discussed the recommendation to have her mother tested to be more certain about interpreting her results.  . Kristen Hines wished to pursue genetic testing and a blood sample will be sent for analysis of the 47 genes on [facility]'s Common Cancers panel (APC, ATM, AXIN2, BARD1, BMPR1A, BRCA1, BRCA2, BRIP1, CDH1, CDK4, CDKN2A, CHEK2, CTNNA1, DICER1, EPCAM, GREM1, HOXB13, KIT, MEN1, MLH1, MSH2, MSH3, MSH6, MUTYH, NBN, NF1, NTHL1, PALB2, PDGFRA, PMS2, POLD1, POLE, PTEN, RAD50, RAD51C, RAD51D, SDHA, SDHB, SDHC, SDHD, SMAD4, SMARCA4, STK11, TP53, TSC1, TSC2, VHL). ADDENDUM: After the appointment, Dr. Matthew Saras made me aware that Kristen Hines has already been tested through [facility] panel and has a pathogenic NBN mutation. Her [facility] testing has been canceled. She was contacted on 07/16/17 to discuss her results. Please see the clinic note from that date.    Dr. Doris Cheadle was available for questions concerning this case. Total time spent by me in face-to-face counseling was approximately 30 minutes.   _____________________________________________________________________   History of Present Illness: Kristen Hines, a 37 y.o. female, is being seen at the [facility] due to a family history of cancer. She presents to clinic today to discuss the possibility of a hereditary predisposition to cancer and discuss whether genetic testing is warranted.  [name] has no personal history of cancer. She reported having a yearly mammogram and recently a breast MRI was added due to her family history. She has a yearly gynecologic exam. She had a hysterectomy, but her ovaries remain intact.  Past Medical History:  Diagnosis Date  . Abnormal uterine bleeding (AUB)   . Dry cough   . Family history of breast cancer   . Family history of pancreatic cancer   . Heart murmur    per pt mild asymptomatic  . Leiomyoma of uterus     Past Surgical History:  Procedure Laterality Date  . ABDOMINAL HYSTERECTOMY Bilateral 04/16/2015   Procedure: HYSTERECTOMY ABDOMINAL WITH BILATERAL SALPINGECTOMY;  Surgeon: Molli Posey, MD;  Location: [facility];  Service: Gynecology;  Laterality: Bilateral;  . LAPAROSCOPIC VAGINAL HYSTERECTOMY WITH SALPINGECTOMY Bilateral 04/16/2015   Procedure:  ATTEMPTED LAPAROSCOPIC ASSISTED VAGINAL HYSTERECTOMY WITH BILATERAL SALPINGECTOMY;  Surgeon: Molli Posey, MD;  Location: [facility];  Service: Gynecology;  Laterality: Bilateral;  . LAPAROSCOPY BILATERAL TUBAL WITH FILCHIE CLIPS/   D & C HYSTEROSCOPY ENDOMETRIAL NOVASURE ABLATION  03-01-2009    Social History   Socioeconomic History  . Marital status: Single    Spouse name: Not on file  . Number of children: Not on file  . Years of education: Not on file  . Highest education level: Not on file  Occupational History  . Not on file  Social Needs  . Emergency planning/management officer  strain: Not on file  . Food insecurity:    Worry: Not on file    Inability: Not on file  . Transportation needs:    Medical: Not on file    Non-medical: Not on file  Tobacco Use  . Smoking status: Never Smoker  . Smokeless tobacco: Never Used  Substance and Sexual Activity  .  Alcohol use: Yes    Comment: SOCIAL  . Drug use: No  . Sexual activity: Not on file  Lifestyle  . Physical activity:    Days per week: Not on file    Minutes per session: Not on file  . Stress: Not on file  Relationships  . Social connections:    Talks on phone: Not on file    Gets together: Not on file    Attends religious service: Not on file    Active member of club or organization: Not on file    Attends meetings of clubs or organizations: Not on file    Relationship status: Not on file  Other Topics Concern  . Not on file  Social History Narrative  . Not on file     Family History:  During the visit, a 4-generation pedigree was obtained. Family tree will be scanned in the Media tab in Epic  Significant diagnoses include the following:  Family History  Problem Relation Age of Onset  . Hypertension Mother   . Breast cancer Mother 78       currently 47  . Hypertension Father   . Heart disease Father   . Diabetes Father   . Pancreatic cancer Maternal Aunt        diagnosed 59s; currently 59s  . Prostate cancer Maternal Grandfather 68       currently 56s    Additionally, Kristen Hines has two daughters (age 21 and 68). She has one sister (age 1). Her mother (noted above) has one brother and 2 sisters. Her father (age 69) has 2 brothers and 2 sisters.  Kristen Hines ancestry is African American. There is no known Jewish ancestry and no consanguinity.  Discussion: We reviewed the characteristics, features and inheritance patterns of hereditary cancer syndromes. We discussed her risk of harboring a mutation in the context of her personal and family history. We discussed the process of genetic testing, insurance coverage and implications of results: positive, negative and variant of unknown significance (VUS). We addressed the limitations of this testing in someone who has not had cancer and the rationale for offering testing to her mother.   [name] questions were  answered to her satisfaction today and she is welcome to call with any additional questions or concerns. Thank you for the referral and allowing [place] to share in the care of your patient.    Steele Berg, MS, [place] Certified Genetic Counselor phone: [phone] [email or web address]

## 2017-07-16 ENCOUNTER — Encounter: Payer: Self-pay | Admitting: Genetic Counselor

## 2017-07-16 ENCOUNTER — Ambulatory Visit: Payer: Self-pay | Admitting: Genetic Counselor

## 2017-07-16 DIAGNOSIS — Z1379 Encounter for other screening for genetic and chromosomal anomalies: Secondary | ICD-10-CM

## 2017-07-16 HISTORY — DX: Encounter for other screening for genetic and chromosomal anomalies: Z13.79

## 2017-07-16 NOTE — Progress Notes (Signed)
[facility]         Results Disclosure    Patient Name: Kristen Hines Patient DOB: June 02, 1980 Patient Age: 37 y.o. Encounter Date: 07/16/2017  Referring Provider: Molli Posey, MD   Kristen Hines was seen in the [facility] on 07/08/2017 due to a family history of cancer and concern regarding a hereditary predisposition to cancer in the family. At that time, genetic testing was discussed. After the appointment, it was clarified that she has already been tested and has a pathogenic NBN mutation. Her testing at [facility] was canceled and we discussed the information below regarding her results.  Genetic Testing: Kristen Hines had genetic analysis of multiple genes to evaluate for a hereditary susceptibility to cancer. Testing included sequencing and deletion/duplication analysis. Testing revealed a pathogenic mutation in the NBN gene called [id number] ([id number]).  A copy of the genetic test report is in Epic under the media tab along with the referral information from Dr. Matthew Saras.  The genes tested were the 29 genes on [facility]'s MyRisk panel: APC, ATM, BARD1, BMPR1A, BRCA1, BRCA2, BRIP1, CDH1, CDK4, CDKN2A (p16INK4a and p14ARF), CHEK2, EPCAM, GREM1, HOXB13, MLH1, MSH2, MSH6, MUTYH, NBN, PALB2, PMS2, POLD1, POLE, PTEN, RAD51C, RAD51D, SMAD4, STK11, TP53.  NBN Cancer Risks: It is important to note that the studies on cancer risk associated with the NBN gene are ongoing. For this reason, the cancer risk estimates are not well understood and are likely to change as new data is obtained from more NBN positive individuals. The lifetime risk of breast cancer in women with one pathogenic NBN mutation is thought to be elevated (possibly by 2-3 times) compared to the general population.   Men with a mutation in the NBN gene have an increased risk of prostate cancer although specific lifetime risk estimates are not available.  Medical Management: Recently,  the [employer]  outlined recommendations for women with an NBN mutation (High-Risk Breast/Ovarian guidelines V3.2019). Because the NCCN updates these guidelines periodically, clinicians are recommended to view the most recent guidelines at https://www.martin.info/.   Breast Cancer:  - Starting at age 94, or 5-10 years prior to the earliest diagnosis of breast cancer in the family (whichever is earlier): Annual mammogram with consideration of tomosynthesis and consideration of annual breast MRI with contrast. Kristen Hines is recommended to start with this approach because her mother was diagnosed with breast cancer at age 46. - There is insufficient evidence at this time to recommend risk-reducing bilateral mastectomies, but this remains a choice that some women may pursue based on their family history.  Kristen Hines is also recommended to continue having a yearly gynecologic exam and speak to her gastroenterologist about when to have her next colonoscopy.   Family Members: It is important that Kristen Hines informs her relatives of this result. They are recommended to speak with a genetic counselor prior to any testing. A genetic counselor can be located at [email or web address]. Her mother and sister both live in [place] so she was given the phone number for the [facility] for them to schedule an appointment.  We discussed that each of Kristen Hines's children and siblings has a 50% chance of having inherited this alteration. We recommended any children she may have meet with a genetic counselor once they are of adult age to learn about their chances of inheriting this mutation and determine if and when they would like to pursue testing.   If a child inherits  two NBN mutations, one from each parent, they would have a rare genetic condition called Nijmegen breakage syndrome (NBS). NBS is associated with microcephaly, a distinct facial appearance, short stature, immunodeficiency,  radiation sensitivity, and a strong predisposition to lymphoid malignancy. For this reason, if anyone in the family who has this NBN mutation is considering having children or has very young children, they are recommended to have their partner tested.   We encouraged Kristen Hines to remain in contact with [place] on an annual basis so we can update her personal and family histories, and let her know of advances in cancer genetics that may benefit the family. Our contact number was provided. Kristen Hines questions were answered to her satisfaction today, and she knows she is welcome to call anytime with additional questions.    Steele Berg, MS, [place] Certified Genetic Counselor phone: [phone] [email or web address]

## 2017-08-17 ENCOUNTER — Telehealth: Payer: Self-pay | Admitting: Oncology

## 2017-08-17 ENCOUNTER — Encounter: Payer: Self-pay | Admitting: Oncology

## 2017-08-17 NOTE — Telephone Encounter (Signed)
New referral from Dr. Matthew Saras for high risk breast clinic. Pt has been scheduled to see Dr. Jana Hakim on 6/25 at 4pm. Pt aware to arrive 30 minutes early. Letter mailed.

## 2017-09-13 NOTE — Progress Notes (Signed)
Kristen Hines  [phone] [phone]     ID: Kristen Hines DOB: Jun 27, 1980  MR#: [id number]  KLK#:[id number]  Patient Care Team: Patient, No Pcp Per as PCP - General (General Practice) OTHER MD: Dr. Matthew Saras  CHIEF COMPLAINT: High risk patient (NBN mutation)  CURRENT TREATMENT: Tamoxifen   HISTORY OF CURRENT ILLNESS: Per the Genetic's counselor's note on 07/16/2017:  Kristen Hines was seen in the genetics clinic om 07/08/2017 to evaluate her susceptibility to cancer. The patient's mother had a history of breast cancer diagnosed in the 71's. There was also a maternal aunt with pancreatic cancer. Genetic testing offered through [facility] panel revealed a pathogenic mutation in the NBN gene called [id number] (p.Asn419Leufs*5).  The genes tested were the 29 genes on [facility]'s MyRisk panel: APC, ATM, BARD1, BMPR1A, BRCA1, BRCA2, BRIP1, CDH1, CDK4, CDKN2A (p16INK4a and p14ARF), CHEK2, EPCAM, GREM1, HOXB13, MLH1, MSH2, MSH6, MUTYH, NBN, PALB2, PMS2, POLD1, POLE, PTEN, RAD51C, RAD51D, SMAD4, STK11, TP53  The lifetime risk of breast cancer in women with one pathogenic NBN mutation is thought to be elevated (possibly by 2-3 times) compared to the general population.  The patient's subsequent history is as detailed below.  INTERVAL HISTORY: Kristen Hines was evaluated in the high risk breast cancer clinic on 09/14/2017.  Prior to her visit today she was scheduled for bilateral breast MRI by Dr. Matthew Saras.  This was performed 07/05/2017.  It found the breast density to be category B.  There was no MR evidence of malignancy.   Kristen Hines usually follows up with Dr. Matthew Saras at his office in January and she gets her mammograms around April. She reports that her most recent mammogram was April 2019 and it was negative for malignancies. She performs regular self exams in addition.  She she denies any recent breast changes or abnormalities.   She is status post hysterectomy  with bilateral salpingectomy 04/16/2015, with benign pathology ( [id number]).  REVIEW OF SYSTEMS: Kristen Hines walks 2 miles for exercise. The patient denies unusual headaches, visual changes, nausea, vomiting, stiff neck, dizziness, or gait imbalance. There has been no cough, phlegm production, or pleurisy, no chest pain or pressure, and no change in bowel or bladder habits. The patient denies fever, rash, bleeding, unexplained fatigue or unexplained weight loss. A detailed review of systems was otherwise entirely negative.   PAST MEDICAL HISTORY: Past Medical History:  Diagnosis Date  . Abnormal uterine bleeding (AUB)   . Dry cough   . Family history of breast cancer   . Family history of pancreatic cancer   . 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 Heart murmur    per pt mild asymptomatic  . Leiomyoma of uterus     PAST SURGICAL HISTORY: Past Surgical History:  Procedure Laterality Date  . ABDOMINAL HYSTERECTOMY Bilateral 04/16/2015   Procedure: HYSTERECTOMY ABDOMINAL WITH BILATERAL SALPINGECTOMY;  Surgeon: Molli Posey, MD;  Location: [facility];  Service: Gynecology;  Laterality: Bilateral;  . LAPAROSCOPIC VAGINAL HYSTERECTOMY WITH SALPINGECTOMY Bilateral 04/16/2015   Procedure:  ATTEMPTED LAPAROSCOPIC ASSISTED VAGINAL HYSTERECTOMY WITH BILATERAL SALPINGECTOMY;  Surgeon: Molli Posey, MD;  Location: [place];  Service: Gynecology;  Laterality: Bilateral;  . LAPAROSCOPY BILATERAL TUBAL WITH FILCHIE CLIPS/   D & C HYSTEROSCOPY ENDOMETRIAL NOVASURE ABLATION  03-01-2009  Partial hysterectomy without BSO due to Fibroids.   FAMILY HISTORY Family History  Problem Relation Age of Onset  . Hypertension Mother   .  Breast cancer Mother 79       currently 68  . Hypertension Father   . Heart disease Father   . Diabetes Father   . Pancreatic cancer Maternal Aunt        diagnosed 38s; currently 70s   . Prostate cancer Maternal Grandfather 56       currently 18s  As of June 2019, the patient's father is alive at 57. The patient's mother is alive at 63 and has a history of breast cancer diagnosed around age 69. The patient has 1 sister and no brothers. There was a maternal aunt with a history of ovarian cancer diagnosed in her 62's. There was also a maternal grandfather who had prostate cancer.    GYNECOLOGIC HISTORY:  Patient's last menstrual period was 04/02/2015 (exact date). Menarche: 37 years old Age at first live birth: 37 years old She is GX P2. She is status post partial hysterectomy with bilateral salpingectomy on 04/16/2015. She retains both ovaries. Prior to hysterectomy she used oral contraceptives, IUDs, and Mirena.  SOCIAL HISTORY:  Kristen Hines works at [employer] in [employer]. The patient's daughter,  Hinton Dyer is 102 and work in a call center. The patient's daughter Kristen Hines is 34. The patient has no grandchildren. She attends [employer].      ADVANCED DIRECTIVES:    HEALTH MAINTENANCE: Social History   Tobacco Use  . Smoking status: Never Smoker  . Smokeless tobacco: Never Used  Substance Use Topics  . Alcohol use: Yes    Comment: SOCIAL  . Drug use: No     Colonoscopy: March 2017/ negative for malignancies  PAP:   Bone density: no   Mammogram: Dr. Matthew Saras April 2019 - normal    No Known Allergies  Current Outpatient Medications  Medication Sig Dispense Refill  . Cyanocobalamin (VITAMIN B-12) 1000 MCG/15ML LIQD Take by mouth as needed.     No current facility-administered medications for this visit.     OBJECTIVE: Young African-American woman in no acute distress  Vitals:   09/14/17 1543  BP: (!) 143/96  Pulse: 61  Resp: 18  Temp: 98.5 F (36.9 C)  SpO2: 100%     Body mass index is 39.35 kg/m.   Wt Readings from Last 3 Encounters:  09/14/17 243 lb 12.8 oz (110.6 kg)  10/05/16 246 lb (111.6 kg)  04/16/15 229 lb 8  oz (104.1 kg)      ECOG FS:0 - Asymptomatic  Ocular: Sclerae unicteric, pupils round and equal Ear-nose-throat: Oropharynx clear and moist Lymphatic: No cervical or supraclavicular adenopathy Lungs no rales or rhonchi Heart regular rate and rhythm Abd soft, nontender, positive bowel sounds MSK no focal spinal tenderness, no joint edema Neuro: non-focal, well-oriented, appropriate affect Breasts: I do not palpate any suspicious masses in either breast.  There are no skin or nipple changes of concern.  Both axillae are benign.   LAB RESULTS:  CMP     Component Value Date/Time   NA 141 04/15/2015 1025   K 4.2 04/15/2015 1025   CL 106 04/15/2015 1025   CO2 29 04/15/2015 1025   GLUCOSE 90 04/15/2015 1025   BUN 10 04/15/2015 1025   CREATININE 0.85 04/15/2015 1025   CREATININE 0.67 05/12/2012 1413   CALCIUM 9.1 04/15/2015 1025   PROT 7.3 04/15/2015 1025   ALBUMIN 4.1 04/15/2015 1025   AST 13 (L) 04/15/2015 1025   ALT 13 (L) 04/15/2015 1025   ALKPHOS 53 04/15/2015 1025   BILITOT 0.5  04/15/2015 1025   GFRNONAA >60 04/15/2015 1025   GFRAA >60 04/15/2015 1025    No results found for: TOTALPROTELP, ALBUMINELP, A1GS, A2GS, BETS, BETA2SER, GAMS, MSPIKE, SPEI  No results found for: KPAFRELGTCHN, LAMBDASER, KAPLAMBRATIO  Lab Results  Component Value Date   WBC 10.3 04/17/2015   HGB 10.3 (L) 04/17/2015   HCT 32.6 (L) 04/17/2015   MCV 83.6 04/17/2015   PLT 231 04/17/2015    @LASTCHEMISTRY @  No results found for: LABCA2  No components found for: [id number]  No results for input(s): INR in the last 168 hours.  No results found for: LABCA2  No results found for: WUJ811  No results found for: BJY782  No results found for: NFA213  No results found for: CA2729  No components found for: HGQUANT  No results found for: CEA1 / No results found for: CEA1   No results found for: AFPTUMOR  No results found for: CHROMOGRNA  No results found for: PSA1  No visits with  results within 3 Day(s) from this visit.  Latest known visit with results is:  Office Visit on 10/05/2016  Component Date Value Ref Range Status  . Yeast by KOH 10/05/2016 Absent  Absent Final  . Yeast by wet prep 10/05/2016 Absent  Absent Final  . WBC by wet prep 10/05/2016 Few  Few Final  . Clue Cells Wet Prep HPF POC 10/05/2016 None  None Final  . Trich by wet prep 10/05/2016 Absent  Absent Final  . Bacteria Wet Prep HPF POC 10/05/2016 Many* Few Final  . Epithelial Cells By [employer] ([facility]) 10/05/2016 Few  None, Few, Too numerous to count Final  . RBC,UR,HPF,POC 10/05/2016 None  None RBC/hpf Final  . HIV Screen 4th Generation wRfx 10/05/2016 Non Reactive  Non Reactive Final  . RPR Ser Ql 10/05/2016 Non Reactive  Non Reactive Final    (this displays the last labs from the last 3 days)  No results found for: TOTALPROTELP, ALBUMINELP, A1GS, A2GS, BETS, BETA2SER, GAMS, MSPIKE, SPEI (this displays SPEP labs)  No results found for: KPAFRELGTCHN, LAMBDASER, KAPLAMBRATIO (kappa/lambda light chains)  No results found for: HGBA, HGBA2QUANT, HGBFQUANT, HGBSQUAN (Hemoglobinopathy evaluation)   No results found for: LDH  No results found for: IRON, TIBC, IRONPCTSAT (Iron and TIBC)  No results found for: FERRITIN  Urinalysis No results found for: COLORURINE, APPEARANCEUR, LABSPEC, PHURINE, GLUCOSEU, HGBUR, BILIRUBINUR, KETONESUR, PROTEINUR, UROBILINOGEN, NITRITE, LEUKOCYTESUR   STUDIES: She completed a bilateral breast MRI on 07/04/2017 showing breast density category B. There was no evidence of malignancy.   ELIGIBLE FOR AVAILABLE RESEARCH PROTOCOL: no  ASSESSMENT: 37 y.o. [place], [place] woman with a deleterious mutation in NBN  (1) genetics testing found a pathogenic mutation in the NBN gene called [id number] (p.Asn419Leufs*5)  (a) no additional deleterious mutations were found in [facility]'s MyRisk panel: APC, ATM, BARD1, BMPR1A, BRCA1, BRCA2, BRIP1, CDH1, CDK4, CDKN2A (p16INK4a  and p14ARF), CHEK2, EPCAM, GREM1, HOXB13, MLH1, MSH2, MSH6, MUTYH, NBN, PALB2, PMS2, POLD1, POLE, PTEN, RAD51C, RAD51D, SMAD4, STK11, TP53.  (2) intensified screening:  (a) breast MRI 07/04/2017--  No suspicious findings  (b) biannual MD breast exam  (3) risk reduction:  (a) tamoxifen started 09/14/2017   PLAN: We spent the better part of today's hour-long appointment discussing the biology of her diagnosis and the specifics of her situation.  Information on mutations to the NBN gene is relatively recent and limited.  However in patients with the best documented mutation involving this gene there is an increased lifetime risk of breast  cancer and that this risk is in the range of 30%.  It is prudent to use this figure in terms of planning.  Secondary issues are increased risk of prostate cancer in men and concerns regarding testing other family members.  NCCN guidelines state that the possibility of risk reducing bilateral mastectomies should be based on family history.  Alternatives of course include intensified screening as well as other risk reduction strategies with antiestrogens.  We discussed these in detail today.  Specifically we went over the options for reconstruction after bilateral mastectomies, and of course these include no reconstruction, implants, whether silicone or saline, muscle flaps, and free flaps.  After this discussion Kristen Hines is very clear that she is not interested in pursuing that strategy.  A second option for risk reduction is antiestrogens.  We discussed the difference between drugs like tamoxifen and raloxifene in the aromatase inhibitors.  Mirena has a good understanding of the possible toxicities, side effects and complications of these agents.  After much discussion she is interested in giving tamoxifen a try.  This should cut her risk of developing breast cancer in half if she can take it for 5 years.  We then turned to intensified screening.  She would be  interested in obtaining a yearly breast MRI in addition to her yearly mammography with tomography.  The problem is that her out-of-pocket cost for the MRI in April was greater than $900 and this is not something she is going to be able to afford.  I wrote out the indications for her yearly MRI, and suggested she contact first of all [facility] to make sure the correct code was entered, and then contact her insurance and make sure they understand that in someone with a greater than 20% lifetime risk of breast cancer yearly MRI is indicated.  If she cannot obtain better funding for yearly MRIs she will likely not be able to follow this strategy, at least until FAST MRI becomes available, perhaps within the next 2 years..  The cost of that version of breast MRI is anticipated to be in the $400-500 range and with insurance coverage of course it would be even less.    Finally I suggested her daughters to be tested when they approach 73 or when they are contemplating family-planning.  As time passes genetics testing should become cheaper and also more informative  Kristen Hines has a good understanding of the overall plan. She agrees with it. She knows the goal of treatment in her case is prevention. She will call with any problems that may develop before her next visit here, which will be in October.   Adekunle Rohrbach, Virgie Dad, MD  09/14/17 4:22 PM Medical Oncology and Hematology [facility] [address] [place], [place] [address] Tel. [phone]    Fax. [phone]  Alice Rieger, am acting as scribe for Chauncey Cruel MD.  I, Lurline Del MD, have reviewed the above documentation for accuracy and completeness, and I agree with the above.

## 2017-09-14 ENCOUNTER — Inpatient Hospital Stay: Payer: 59 | Attending: Genetic Counselor | Admitting: Oncology

## 2017-09-14 ENCOUNTER — Telehealth: Payer: Self-pay | Admitting: Oncology

## 2017-09-14 VITALS — BP 143/96 | HR 61 | Temp 98.5°F | Resp 18 | Ht 66.0 in | Wt 243.8 lb

## 2017-09-14 DIAGNOSIS — Z800 Family history of malignant neoplasm of digestive organs: Secondary | ICD-10-CM

## 2017-09-14 DIAGNOSIS — Z8042 Family history of malignant neoplasm of prostate: Secondary | ICD-10-CM | POA: Diagnosis not present

## 2017-09-14 DIAGNOSIS — Z803 Family history of malignant neoplasm of breast: Secondary | ICD-10-CM

## 2017-09-14 DIAGNOSIS — Z8041 Family history of malignant neoplasm of ovary: Secondary | ICD-10-CM | POA: Insufficient documentation

## 2017-09-14 DIAGNOSIS — Z1379 Encounter for other screening for genetic and chromosomal anomalies: Secondary | ICD-10-CM | POA: Diagnosis present

## 2017-09-14 DIAGNOSIS — Z9189 Other specified personal risk factors, not elsewhere classified: Secondary | ICD-10-CM | POA: Insufficient documentation

## 2017-09-14 MED ORDER — TAMOXIFEN CITRATE 20 MG PO TABS: 20.0000 mg | ORAL_TABLET | Freq: Every day | ORAL | 12 refills | Status: AC

## 2017-09-14 NOTE — Telephone Encounter (Signed)
Per 6/25 no los

## 2018-01-05 NOTE — Progress Notes (Signed)
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 The patient's mother had a history of breast cancer diagnosed in the 52's. There was also a maternal aunt with pancreatic cancer. 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  The genes tested were the 29 genes on [facility]'s MyRisk panel: APC, ATM, BARD1, BMPR1A, BRCA1, BRCA2, BRIP1, CDH1, CDK4, CDKN2A (p16INK4a and p14ARF), CHEK2, EPCAM, GREM1, HOXB13, MLH1, MSH2, MSH6, MUTYH, NBN, PALB2, PMS2, POLD1, POLE, PTEN, RAD51C, RAD51D, SMAD4, STK11, TP53  The lifetime risk of breast cancer in women with one pathogenic NBN mutation is thought to be elevated (possibly by 2-3 times) compared to the general population.  The patient's subsequent history is as detailed below.  INTERVAL HISTORY: Kristen Hines returns today for follow up of her high risk in breast cancer due to her NBN gene mutation. She discontinued tamoxifen about 1 month ago. She had abdominal cramping and constipation associated with the drug with no BM for 1 week. The constipation resolved when she stopped taking tamoxifen. She also had swelling in her hands and feet; this also resovled when she stopped tamoxifen. She denies a change in diet or activities. She has occassional hot flashes, and she was already having a slight  vaginal discharge prior to start of the drug, unchanged when on tamoxifen.   She gets her mammograms at Dr. Delanna Ahmadi office and breast MRI at [facility]. She notes that the MRI was $900 out of pocket. She has some occasional nipple soreness, which may be associated with the specific bra she wears at times   REVIEW OF SYSTEMS: Kristen Hines goes to the gym twice per week for exercise. She also does walking. She denies unusual headaches, visual changes, nausea, vomiting, or dizziness. There has been no unusual cough, phlegm production, or pleurisy. There has been no change in bowel or bladder habits. She denies unexplained fatigue or unexplained weight loss, bleeding, rash, or fever. A detailed review of systems was otherwise stable.     PAST MEDICAL HISTORY: Past Medical History:  Diagnosis Date  . Abnormal uterine bleeding (AUB)   . Dry cough   . Family history of breast cancer   . Family history of pancreatic cancer   . 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 Heart murmur    per pt mild asymptomatic  . Leiomyoma of uterus     PAST SURGICAL HISTORY: Past Surgical History:  Procedure Laterality Date  . ABDOMINAL HYSTERECTOMY Bilateral 04/16/2015   Procedure: HYSTERECTOMY ABDOMINAL WITH BILATERAL SALPINGECTOMY;  Surgeon: Molli Posey, MD;  [facility];  Service: Gynecology;  Laterality: Bilateral;  . LAPAROSCOPIC VAGINAL HYSTERECTOMY WITH SALPINGECTOMY Bilateral 04/16/2015   Procedure:  ATTEMPTED LAPAROSCOPIC ASSISTED VAGINAL HYSTERECTOMY WITH BILATERAL SALPINGECTOMY;  Surgeon: Molli Posey, MD;  Location: [place];  Service: Gynecology;  Laterality: Bilateral;  . LAPAROSCOPY BILATERAL TUBAL WITH FILCHIE  CLIPS/   D & C HYSTEROSCOPY ENDOMETRIAL NOVASURE ABLATION  03-01-2009  Partial hysterectomy without BSO due to Fibroids.   FAMILY HISTORY Family History  Problem Relation Age of  Onset  . Hypertension Mother   . Breast cancer Mother 77       currently 3  . Hypertension Father   . Heart disease Father   . Diabetes Father   . Pancreatic cancer Maternal Aunt        diagnosed 78s; currently 53s  . Prostate cancer Maternal Grandfather 65       currently 52s  As of June 2019, the patient's father is alive at 16. The patient's mother is alive at 50 and has a history of breast cancer diagnosed around age 41. The patient has 1 sister and no brothers. There was a maternal aunt with a history of ovarian cancer diagnosed in her 73's. There was also a maternal grandfather who had prostate cancer.    GYNECOLOGIC HISTORY:  Patient's last menstrual period was 04/02/2015 (exact date). Menarche: 37 years old Age at first live birth: 37 years old She is GX P2. She is status post partial hysterectomy with bilateral salpingectomy on 04/16/2015. She retains both ovaries. Prior to hysterectomy she used oral contraceptives, IUDs, and Mirena.  SOCIAL HISTORY:  Kristen Hines works at [employer] in [employer]. The patient's daughter,  Kristen Hines is 11 and work in a call center. The patient's daughter Kristen Hines is 5. The patient has no grandchildren. She attends [employer].      ADVANCED DIRECTIVES:    HEALTH MAINTENANCE: Social History   Tobacco Use  . Smoking status: Never Smoker  . Smokeless tobacco: Never Used  Substance Use Topics  . Alcohol use: Yes    Comment: SOCIAL  . Drug use: No     Colonoscopy: March 2017/ negative for malignancies  PAP:   Bone density: no   Mammogram: Dr. Matthew Saras April 2019 - normal    No Known Allergies  Current Outpatient Medications  Medication Sig Dispense Refill  . Cyanocobalamin (VITAMIN B-12) 1000 MCG/15ML LIQD Take by mouth as needed.    . raloxifene (EVISTA) 60 MG tablet Take 1 tablet (60 mg total) by mouth daily. 90 tablet 4   No current facility-administered medications for this visit.     OBJECTIVE:  Young African-American woman who appears well  Vitals:   01/06/18 1020  BP: (!) 142/99  Pulse: (!) 56  Resp: 18  Temp: 98.4 F (36.9 C)  SpO2: 100%     Body mass index is 39.06 kg/m.   Wt Readings from Last 3 Encounters:  01/06/18 242 lb (109.8 kg)  09/14/17 243 lb 12.8 oz (110.6 kg)  10/05/16 246 lb (111.6 kg)      ECOG FS:0 - Asymptomatic  Sclerae unicteric, EOMs intact Oropharynx clear and moist No cervical or supraclavicular adenopathy Lungs no rales or rhonchi Heart regular rate and rhythm Abd soft, nontender, positive bowel sounds MSK no focal spinal tenderness, no upper extremity lymphedema Neuro: nonfocal, well oriented, appropriate affect Breasts: No skin or nipple changes in either breast.  No suspicious findings and no masses palpated.  Both axillae are benign   LAB RESULTS:  CMP     Component Value Date/Time   NA 141 04/15/2015 1025   K 4.2 04/15/2015 1025   CL 106 04/15/2015 1025   CO2 29 04/15/2015 1025   GLUCOSE 90 04/15/2015 1025   BUN 10 04/15/2015 1025   CREATININE 0.85 04/15/2015  1025   CREATININE 0.67 05/12/2012 1413   CALCIUM 9.1 04/15/2015 1025   PROT 7.3 04/15/2015 1025   ALBUMIN 4.1 04/15/2015 1025   AST 13 (L) 04/15/2015 1025   ALT 13 (L) 04/15/2015 1025   ALKPHOS 53 04/15/2015 1025   BILITOT 0.5 04/15/2015 1025   GFRNONAA >60 04/15/2015 1025   GFRAA >60 04/15/2015 1025    No results found for: TOTALPROTELP, ALBUMINELP, A1GS, A2GS, BETS, BETA2SER, GAMS, MSPIKE, SPEI  No results found for: KPAFRELGTCHN, LAMBDASER, KAPLAMBRATIO  Lab Results  Component Value Date   WBC 10.3 04/17/2015   HGB 10.3 (L) 04/17/2015   HCT 32.6 (L) 04/17/2015   MCV 83.6 04/17/2015   PLT 231 04/17/2015    @LASTCHEMISTRY @  No results found for: LABCA2  No components found for: RDEYCX448  No results for input(s): INR in the last 168 hours.  No results found for: LABCA2  No results found for: JEH631  No results found for: SHF026  No results  found for: VZC588  No results found for: CA2729  No components found for: HGQUANT  No results found for: CEA1 / No results found for: CEA1   No results found for: AFPTUMOR  No results found for: CHROMOGRNA  No results found for: PSA1  No visits with results within 3 Day(s) from this visit.  Latest known visit with results is:  Office Visit on 10/05/2016  Component Date Value Ref Range Status  . Yeast by KOH 10/05/2016 Absent  Absent Final  . Yeast by wet prep 10/05/2016 Absent  Absent Final  . WBC by wet prep 10/05/2016 Few  Few Final  . Clue Cells Wet Prep HPF POC 10/05/2016 None  None Final  . Trich by wet prep 10/05/2016 Absent  Absent Final  . Bacteria Wet Prep HPF POC 10/05/2016 Many* Few Final  . Epithelial Cells By [employer] ([facility]) 10/05/2016 Few  None, Few, Too numerous to count Final  . RBC,UR,HPF,POC 10/05/2016 None  None RBC/hpf Final  . HIV Screen 4th Generation wRfx 10/05/2016 Non Reactive  Non Reactive Final  . RPR Ser Ql 10/05/2016 Non Reactive  Non Reactive Final    (this displays the last labs from the last 3 days)  No results found for: TOTALPROTELP, ALBUMINELP, A1GS, A2GS, BETS, BETA2SER, GAMS, MSPIKE, SPEI (this displays SPEP labs)  No results found for: KPAFRELGTCHN, LAMBDASER, KAPLAMBRATIO (kappa/lambda light chains)  No results found for: HGBA, HGBA2QUANT, HGBFQUANT, HGBSQUAN (Hemoglobinopathy evaluation)   No results found for: LDH  No results found for: IRON, TIBC, IRONPCTSAT (Iron and TIBC)  No results found for: FERRITIN  Urinalysis No results found for: COLORURINE, APPEARANCEUR, LABSPEC, PHURINE, GLUCOSEU, HGBUR, BILIRUBINUR, KETONESUR, PROTEINUR, UROBILINOGEN, NITRITE, LEUKOCYTESUR   STUDIES: No results found.   ELIGIBLE FOR AVAILABLE RESEARCH PROTOCOL: no  ASSESSMENT: 37 y.o. [place], [place] woman with a deleterious mutation in NBN  (1) genetics testing found a pathogenic mutation in the NBN gene called [phone]  (p.Asn419Leufs*5)  (a) no additional deleterious mutations were found in [facility]'s MyRisk panel: APC, ATM, BARD1, BMPR1A, BRCA1, BRCA2, BRIP1, CDH1, CDK4, CDKN2A (p16INK4a and p14ARF), CHEK2, EPCAM, GREM1, HOXB13, MLH1, MSH2, MSH6, MUTYH, NBN, PALB2, PMS2, POLD1, POLE, PTEN, RAD51C, RAD51D, SMAD4, STK11, TP53.  (2) intensified screening:  (a) breast MRI 07/04/2017--  No suspicious findings  (b) biannual MD breast exam  (3) risk reduction:  (a) tamoxifen started 09/14/2017, discontinued September 2019 with side effects  (b) raloxifene started October 2019   PLAN:  Evadene had some unusual side effects on tamoxifen,  but she stopped the drug and then restarted and the side effects came back so we certainly are staying off that medication.  Today we discussed raloxifene.  Perhaps she will be able to tolerate this better.  We discussed the possible toxicity side effects and complications of this agent and she will call me if she has any problems with it.  The original plan was for mammography in January and a breast MRI in July, but it appears her insurance has not approved the MRI despite her being a high risk patient.  We may have to back off some on the intensified screening strategy.  She will have her next mammogram in January.  She is going to see me in February to make sure she is tolerating the raloxifene well and to further discuss the intensified screening issue  She knows to call for any other problems that may develop before the next visit.  Birchwood Antolin, Virgie Dad, MD  01/06/18 10:48 AM Medical Oncology and Hematology [facility] [address] [place],  [address] Tel. [phone]    Fax. [phone]  Alice Rieger, am acting as scribe for Chauncey Cruel MD.  I, Lurline Del MD, have reviewed the above documentation for accuracy and completeness, and I agree with the above.

## 2018-01-06 ENCOUNTER — Inpatient Hospital Stay: Payer: 59 | Attending: Oncology | Admitting: Oncology

## 2018-01-06 ENCOUNTER — Telehealth: Payer: Self-pay | Admitting: Oncology

## 2018-01-06 VITALS — BP 142/99 | HR 56 | Temp 98.4°F | Resp 18 | Ht 66.0 in | Wt 242.0 lb

## 2018-01-06 DIAGNOSIS — Z9189 Other specified personal risk factors, not elsewhere classified: Secondary | ICD-10-CM | POA: Diagnosis not present

## 2018-01-06 DIAGNOSIS — Z1379 Encounter for other screening for genetic and chromosomal anomalies: Secondary | ICD-10-CM | POA: Diagnosis present

## 2018-01-06 DIAGNOSIS — N939 Abnormal uterine and vaginal bleeding, unspecified: Secondary | ICD-10-CM

## 2018-01-06 MED ORDER — RALOXIFENE HCL 60 MG PO TABS: 60.0000 mg | ORAL_TABLET | Freq: Every day | ORAL | 4 refills | Status: DC

## 2018-01-06 NOTE — Telephone Encounter (Signed)
Gave avs and calendar ° °

## 2018-05-02 ENCOUNTER — Inpatient Hospital Stay: Payer: 59 | Attending: Oncology

## 2018-05-09 ENCOUNTER — Inpatient Hospital Stay: Payer: 59 | Admitting: Oncology

## 2019-06-01 ENCOUNTER — Encounter (HOSPITAL_COMMUNITY): Payer: Self-pay

## 2019-06-01 ENCOUNTER — Emergency Department (HOSPITAL_COMMUNITY): Payer: 59

## 2019-06-01 ENCOUNTER — Emergency Department (HOSPITAL_COMMUNITY)
Admission: EM | Admit: 2019-06-01 | Discharge: 2019-06-01 | Disposition: A | Discharge status: Home / Self Care | Payer: 59 | Attending: Emergency Medicine | Admitting: Emergency Medicine

## 2019-06-01 ENCOUNTER — Other Ambulatory Visit: Payer: Self-pay

## 2019-06-01 DIAGNOSIS — R1031 Right lower quadrant pain: Secondary | ICD-10-CM | POA: Diagnosis present

## 2019-06-01 DIAGNOSIS — N83201 Unspecified ovarian cyst, right side: Secondary | ICD-10-CM | POA: Insufficient documentation

## 2019-06-01 DIAGNOSIS — R350 Frequency of micturition: Secondary | ICD-10-CM | POA: Diagnosis not present

## 2019-06-01 LAB — COMPREHENSIVE METABOLIC PANEL
ALT: 23 U/L (ref 0–44)
AST: 24 U/L (ref 15–41)
Albumin: 3.9 g/dL (ref 3.5–5.0)
Alkaline Phosphatase: 54 U/L (ref 38–126)
Anion gap: 8 (ref 5–15)
BUN: 8 mg/dL (ref 6–20)
CO2: 27 mmol/L (ref 22–32)
Calcium: 8.8 mg/dL — ABNORMAL LOW (ref 8.9–10.3)
Chloride: 104 mmol/L (ref 98–111)
Creatinine, Ser: 0.77 mg/dL (ref 0.44–1.00)
GFR calc Af Amer: >60 mL/min (ref >60)
GFR calc non Af Amer: >60 mL/min (ref >60)
Glucose, Bld: 92 mg/dL (ref 70–99)
Potassium: 4 mmol/L (ref 3.5–5.1)
Sodium: 139 mmol/L (ref 135–145)
Total Bilirubin: 0.6 mg/dL (ref 0.3–1.2)
Total Protein: 7.1 g/dL (ref 6.5–8.1)

## 2019-06-01 LAB — URINALYSIS, ROUTINE W REFLEX MICROSCOPIC
Bacteria, UA: NONE SEEN
Bilirubin Urine: NEGATIVE
Glucose, UA: NEGATIVE mg/dL
Ketones, ur: 5 mg/dL — AB
Leukocytes,Ua: NEGATIVE
Nitrite: NEGATIVE
Protein, ur: NEGATIVE mg/dL
Specific Gravity, Urine: 1.003 — ABNORMAL LOW (ref 1.005–1.030)
pH: 6 (ref 5.0–8.0)

## 2019-06-01 LAB — CBC
HCT: 35.3 % — ABNORMAL LOW (ref 36.0–46.0)
Hemoglobin: 11.1 g/dL — ABNORMAL LOW (ref 12.0–15.0)
MCH: 26.1 pg (ref 26.0–34.0)
MCHC: 31.4 g/dL (ref 30.0–36.0)
MCV: 83.1 fL (ref 80.0–100.0)
Platelets: 226 10*3/uL (ref 150–400)
RBC: 4.25 MIL/uL (ref 3.87–5.11)
RDW: 13.8 % (ref 11.5–15.5)
WBC: 5.6 10*3/uL (ref 4.0–10.5)
nRBC: 0 % (ref 0.0–0.2)

## 2019-06-01 LAB — LIPASE, BLOOD: Lipase: 17 U/L (ref 11–51)

## 2019-06-01 MED ORDER — IOHEXOL 300 MG/ML  SOLN
100.0000 mL | Freq: Once | INTRAMUSCULAR | Status: AC | PRN
  Administered 2019-06-01: 100 mL via INTRAVENOUS

## 2019-06-01 MED ORDER — IBUPROFEN 800 MG PO TABS: 800.0000 mg | ORAL_TABLET | Freq: Three times a day (TID) | ORAL | 0 refills | Status: DC

## 2019-06-01 MED ORDER — MORPHINE SULFATE (PF) 4 MG/ML IV SOLN
4.0000 mg | Freq: Once | INTRAVENOUS | Status: AC
  Administered 2019-06-01: 4 mg via INTRAVENOUS
  Filled 2019-06-01: qty 1

## 2019-06-01 MED ORDER — SODIUM CHLORIDE 0.9% FLUSH
3.0000 mL | Freq: Once | INTRAVENOUS | Status: AC
  Administered 2019-06-01: 3 mL via INTRAVENOUS

## 2019-06-01 MED ORDER — SODIUM CHLORIDE (PF) 0.9 % IJ SOLN
INTRAMUSCULAR | Status: AC
  Filled 2019-06-01: qty 50

## 2019-06-01 MED ORDER — SODIUM CHLORIDE 0.9 % IV BOLUS
1000.0000 mL | Freq: Once | INTRAVENOUS | Status: AC
  Administered 2019-06-01: 1000 mL via INTRAVENOUS

## 2019-06-01 MED ORDER — ONDANSETRON HCL 4 MG/2ML IJ SOLN
4.0000 mg | Freq: Once | INTRAMUSCULAR | Status: AC
  Administered 2019-06-01: 4 mg via INTRAVENOUS
  Filled 2019-06-01: qty 2

## 2019-06-01 NOTE — ED Notes (Addendum)
Pt verbalizes understanding of DC instructions. Pt belongings returned and is ambulatory out of ED.   Signature pad NA

## 2019-06-01 NOTE — ED Provider Notes (Signed)
[place] DEPT Provider Note   CSN: [id number] Arrival date & time: 06/01/19  7903     History Chief Complaint  Patient presents with  . Abdominal Pain  . Nausea  . Urinary Frequency    Kristen Hines is a 39 y.o. female.  Pt presents to the ED today with RLQ pain.  The pt said her pain woke her up from sleep today around 0200 today.  Pt said pain does not move.  She has some nausea, but no vomiting.  No fever.  No sob.  Pt had a hysterectomy in 2017, but still has both of her ovaries.        Past Medical History:  Diagnosis Date  . Abnormal uterine bleeding (AUB)   . Dry cough   . Family history of breast cancer   . Family history of pancreatic cancer   . 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 Heart murmur    per pt mild asymptomatic  . Leiomyoma of uterus     Patient Active Problem List   Diagnosis Date Noted  . At high risk for breast cancer 09/14/2017  . Genetic testing 07/16/2017  . Family history of breast cancer   . Family history of pancreatic cancer   . Abnormal uterine bleeding 04/16/2015    Past Surgical History:  Procedure Laterality Date  . ABDOMINAL HYSTERECTOMY Bilateral 04/16/2015   Procedure: HYSTERECTOMY ABDOMINAL WITH BILATERAL SALPINGECTOMY;  Surgeon: Molli Posey, MD;  Location: [facility];  Service: Gynecology;  Laterality: Bilateral;  . LAPAROSCOPIC VAGINAL HYSTERECTOMY WITH SALPINGECTOMY Bilateral 04/16/2015   Procedure:  ATTEMPTED LAPAROSCOPIC ASSISTED VAGINAL HYSTERECTOMY WITH BILATERAL SALPINGECTOMY;  Surgeon: Molli Posey, MD;  Location: [place];  Service: Gynecology;  Laterality: Bilateral;  . LAPAROSCOPY BILATERAL TUBAL WITH FILCHIE CLIPS/   D & C HYSTEROSCOPY ENDOMETRIAL NOVASURE ABLATION  03-01-2009     OB History   No obstetric history on file.     Family History  Problem Relation Age of Onset   . Hypertension Mother   . Breast cancer Mother 39       currently 68  . Hypertension Father   . Heart disease Father   . Diabetes Father   . Pancreatic cancer Maternal Aunt        diagnosed 64s; currently 59s  . Prostate cancer Maternal Grandfather 72       currently 66s    Social History   Tobacco Use  . Smoking status: Never Smoker  . Smokeless tobacco: Never Used  Substance Use Topics  . Alcohol use: Yes    Comment: SOCIAL  . Drug use: No    Home Medications Prior to Admission medications   Medication Sig Start Date End Date Taking? Authorizing Provider  ibuprofen (ADVIL) 800 MG tablet Take 1 tablet (800 mg total) by mouth 3 (three) times daily. 06/01/19   Isla Pence, MD  raloxifene (EVISTA) 60 MG tablet Take 1 tablet (60 mg total) by mouth daily. Patient not taking: Reported on 06/01/2019 01/06/18   Magrinat, Virgie Dad, MD    Allergies    Patient has no known allergies.  Review of Systems   Review of Systems  Gastrointestinal: Positive for abdominal pain and nausea.  All other systems reviewed and are negative.   Physical Exam Updated Vital Signs BP (!) 144/91   Pulse (!) 57   Temp 98 F (36.7 C) (Oral)  Resp 18   Ht 5' 6"  (1.676 m)   Wt 113.4 kg   LMP 04/02/2015 (Exact Date)   SpO2 98%   BMI 40.35 kg/m   Physical Exam Vitals and nursing note reviewed.  Constitutional:      Appearance: She is well-developed.  HENT:     Head: Normocephalic and atraumatic.     Mouth/Throat:     Mouth: Mucous membranes are moist.     Pharynx: Oropharynx is clear.  Eyes:     Extraocular Movements: Extraocular movements intact.     Pupils: Pupils are equal, round, and reactive to light.  Cardiovascular:     Rate and Rhythm: Normal rate and regular rhythm.  Pulmonary:     Effort: Pulmonary effort is normal.     Breath sounds: Normal breath sounds.  Abdominal:     General: Abdomen is flat. Bowel sounds are normal.     Palpations: Abdomen is soft.      Tenderness: There is abdominal tenderness in the right lower quadrant.  Skin:    General: Skin is warm.     Capillary Refill: Capillary refill takes less than 2 seconds.  Neurological:     General: No focal deficit present.     Mental Status: She is alert and oriented to person, place, and time.  Psychiatric:        Mood and Affect: Mood normal.        Behavior: Behavior normal.     ED Results / Procedures / Treatments   Labs (all labs ordered are listed, but only abnormal results are displayed) Labs Reviewed  COMPREHENSIVE METABOLIC PANEL - Abnormal; Notable for the following components:      Result Value   Calcium 8.8 (*)    All other components within normal limits  CBC - Abnormal; Notable for the following components:   Hemoglobin 11.1 (*)    HCT 35.3 (*)    All other components within normal limits  URINALYSIS, ROUTINE W REFLEX MICROSCOPIC - Abnormal; Notable for the following components:   Color, Urine STRAW (*)    Specific Gravity, Urine 1.003 (*)    Hgb urine dipstick SMALL (*)    Ketones, ur 5 (*)    All other components within normal limits  LIPASE, BLOOD    EKG None  Radiology CT ABDOMEN PELVIS W CONTRAST  Result Date: 06/01/2019 CLINICAL DATA:  Right lower quadrant abdomen pain EXAM: CT ABDOMEN AND PELVIS WITH CONTRAST TECHNIQUE: Multidetector CT imaging of the abdomen and pelvis was performed using the standard protocol following bolus administration of intravenous contrast. CONTRAST:  150m OMNIPAQUE IOHEXOL 300 MG/ML  SOLN COMPARISON:  None. FINDINGS: Lower chest: No acute abnormality. Hepatobiliary: No focal liver abnormality is seen. No gallstones, gallbladder wall thickening, or biliary dilatation. Pancreas: Unremarkable. No pancreatic ductal dilatation or surrounding inflammatory changes. Spleen: Normal in size without focal abnormality. Adrenals/Urinary Tract: Adrenal glands are unremarkable. Kidneys are normal, without renal calculi, focal lesion, or  hydronephrosis. Bladder is unremarkable. Stomach/Bowel: Stomach is within normal limits. Appendix appears normal. No evidence of bowel wall thickening, distention, or inflammatory changes. Vascular/Lymphatic: No significant vascular findings are present. No enlarged abdominal or pelvic lymph nodes. Reproductive: The uterus is normal. Right ovarian cysts are identified, likely follicular cyst. The left adnexa is normal. Other: None. Musculoskeletal: Degenerative joint changes of lower lumbar spine are noted. IMPRESSION: 1. No acute abnormality identified in the abdomen and pelvis. The appendix is normal. 2. Right ovarian cysts, likely follicular cyst. Electronically Signed  By: Abelardo Diesel M.D.   On: 06/01/2019 12:35    Procedures Procedures (including critical care time)  Medications Ordered in ED Medications  sodium chloride flush (NS) 0.9 % injection 3 mL (3 mLs Intravenous Given 06/01/19 1025)  sodium chloride 0.9 % bolus 1,000 mL (1,000 mLs Intravenous New Bag/Given 06/01/19 1030)  morphine 4 MG/ML injection 4 mg (4 mg Intravenous Given 06/01/19 1027)  ondansetron (ZOFRAN) injection 4 mg (4 mg Intravenous Given 06/01/19 1026)  iohexol (OMNIPAQUE) 300 MG/ML solution 100 mL (100 mLs Intravenous Contrast Given 06/01/19 1127)  sodium chloride (PF) 0.9 % injection (  Given by Other 06/01/19 1145)    ED Course  I have reviewed the triage vital signs and the nursing notes.  Pertinent labs & imaging results that were available during my care of the patient were reviewed by me and considered in my medical decision making (see chart for details).    MDM Rules/Calculators/A&P                      Pt is feeling much better.  Labs nl.  Appendix nl.  Pt does have a right ovarian cyst which is likely the cause of pain.  Pt is stable for d/c.  Return if worse. Final Clinical Impression(s) / ED Diagnoses Final diagnoses:  Right lower quadrant abdominal pain  Cyst of right ovary    Rx / DC Orders ED  Discharge Orders         Ordered    ibuprofen (ADVIL) 800 MG tablet  3 times daily     06/01/19 1245           Isla Pence, MD 06/01/19 1246

## 2019-06-01 NOTE — ED Notes (Signed)
Pt transported to imaging.

## 2019-06-01 NOTE — ED Triage Notes (Signed)
Patient c/o RLQ pain that started at 0200 today. Patient also c/o nausea and urinary frequency. Patient denies any vaginal discharge.

## 2019-06-01 NOTE — ED Notes (Signed)
Pt ambulatory to RR independently  

## 2019-06-16 ENCOUNTER — Other Ambulatory Visit: Payer: Self-pay | Admitting: Obstetrics and Gynecology

## 2019-06-16 DIAGNOSIS — Q644 Malformation of urachus: Secondary | ICD-10-CM

## 2019-06-19 ENCOUNTER — Ambulatory Visit
Admission: RE | Admit: 2019-06-19 | Discharge: 2019-06-19 | Disposition: A | Discharge status: Home / Self Care | Payer: 59 | Source: Ambulatory Visit | Attending: Obstetrics and Gynecology | Admitting: Obstetrics and Gynecology

## 2019-06-19 DIAGNOSIS — Q644 Malformation of urachus: Secondary | ICD-10-CM

## 2020-06-13 ENCOUNTER — Other Ambulatory Visit: Payer: Self-pay

## 2020-06-13 ENCOUNTER — Ambulatory Visit
Admission: EM | Admit: 2020-06-13 | Discharge: 2020-06-13 | Disposition: A | Discharge status: Home / Self Care | Payer: 59 | Attending: Emergency Medicine | Admitting: Emergency Medicine

## 2020-06-13 DIAGNOSIS — M545 Low back pain, unspecified: Secondary | ICD-10-CM

## 2020-06-13 DIAGNOSIS — M542 Cervicalgia: Secondary | ICD-10-CM | POA: Diagnosis not present

## 2020-06-13 MED ORDER — TIZANIDINE HCL 2 MG PO TABS: 2.0000 mg | ORAL_TABLET | Freq: Four times a day (QID) | ORAL | 0 refills | Status: DC | PRN

## 2020-06-13 MED ORDER — PREDNISONE 10 MG PO TABS: ORAL_TABLET | ORAL | 0 refills | Status: DC

## 2020-06-13 NOTE — ED Provider Notes (Signed)
[facility]    CSN: [id number] Arrival date & time: 06/13/20  1028      History   Chief Complaint Chief Complaint  Patient presents with  . Back Pain  . Neck Pain    HPI Kristen Hines is a 40 y.o. female presenting today for evaluation of neck and back pain.  Symptoms began 1.5 weeks ago.  Denies any injury or trauma.  Denies increase in activity or heavy lifting.  Pain mainly right-sided.  Denies history of similar.  Denies any associated dizziness or lightheadedness.  Denies any radiation into extremities or paresthesias.  Using ibuprofen without relief.  Denies urination symptoms.  HPI  Past Medical History:  Diagnosis Date  . Abnormal uterine bleeding (AUB)   . Dry cough   . Family history of breast cancer   . Family history of pancreatic cancer   . 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 Heart murmur    per pt mild asymptomatic  . Leiomyoma of uterus     Patient Active Problem List   Diagnosis Date Noted  . At high risk for breast cancer 09/14/2017  . Genetic testing 07/16/2017  . Family history of breast cancer   . Family history of pancreatic cancer   . Abnormal uterine bleeding 04/16/2015    Past Surgical History:  Procedure Laterality Date  . ABDOMINAL HYSTERECTOMY Bilateral 04/16/2015   Procedure: HYSTERECTOMY ABDOMINAL WITH BILATERAL SALPINGECTOMY;  Surgeon: Molli Posey, MD;  Location: [facility];  Service: Gynecology;  Laterality: Bilateral;  . LAPAROSCOPIC VAGINAL HYSTERECTOMY WITH SALPINGECTOMY Bilateral 04/16/2015   Procedure:  ATTEMPTED LAPAROSCOPIC ASSISTED VAGINAL HYSTERECTOMY WITH BILATERAL SALPINGECTOMY;  Surgeon: Molli Posey, MD;  Location: [place];  Service: Gynecology;  Laterality: Bilateral;  . LAPAROSCOPY BILATERAL TUBAL WITH FILCHIE CLIPS/   D & C HYSTEROSCOPY ENDOMETRIAL NOVASURE ABLATION  03-01-2009    OB History   No  obstetric history on file.      Home Medications    Prior to Admission medications   Medication Sig Start Date End Date Taking? Authorizing Provider  predniSONE (DELTASONE) 10 MG tablet Begin with 6 tabs on day 1, 5 tab on day 2, 4 tab on day 3, 3 tab on day 4, 2 tab on day 5, 1 tab on day 6-take with food 06/13/20  Yes Santanna Olenik C, PA-C  tiZANidine (ZANAFLEX) 2 MG tablet Take 1-2 tablets (2-4 mg total) by mouth every 6 (six) hours as needed for muscle spasms. 06/13/20  Yes Taesha Goodell C, PA-C  ibuprofen (ADVIL) 800 MG tablet Take 1 tablet (800 mg total) by mouth 3 (three) times daily. 06/01/19   Isla Pence, MD  raloxifene (EVISTA) 60 MG tablet Take 1 tablet (60 mg total) by mouth daily. Patient not taking: Reported on 06/01/2019 01/06/18   Magrinat, Virgie Dad, MD    Family History Family History  Problem Relation Age of Onset  . Hypertension Mother   . Breast cancer Mother 22       currently 52  . Hypertension Father   . Heart disease Father   . Diabetes Father   . Pancreatic cancer Maternal Aunt        diagnosed 71s; currently 45s  . Prostate cancer Maternal Grandfather 80       currently 53s    Social History Social History   Tobacco Use  . Smoking status: Never Smoker  . Smokeless tobacco:  Never Used  Vaping Use  . Vaping Use: Never used  Substance Use Topics  . Alcohol use: Yes    Comment: SOCIAL  . Drug use: No     Allergies   Patient has no known allergies.   Review of Systems Review of Systems  Constitutional: Negative for activity change, chills, diaphoresis and fatigue.  HENT: Negative for ear pain, tinnitus and trouble swallowing.   Eyes: Negative for photophobia and visual disturbance.  Respiratory: Negative for cough, chest tightness and shortness of breath.   Cardiovascular: Negative for chest pain and leg swelling.  Gastrointestinal: Negative for abdominal pain, blood in stool, nausea and vomiting.  Musculoskeletal: Positive for back  pain and myalgias. Negative for arthralgias, gait problem, neck pain and neck stiffness.  Skin: Negative for color change and wound.  Neurological: Negative for dizziness, weakness, light-headedness, numbness and headaches.     Physical Exam Triage Vital Signs ED Triage Vitals  Enc Vitals Group     BP 06/13/20 1039 (!) 176/130     Pulse Rate 06/13/20 1039 81     Resp 06/13/20 1039 16     Temp 06/13/20 1039 98.9 F (37.2 C)     Temp Source 06/13/20 1039 Oral     SpO2 06/13/20 1039 98 %     Weight --      Height --      Head Circumference --      Peak Flow --      Pain Score 06/13/20 1045 5     Pain Loc --      Pain Edu? --      Excl. in [place]? --    No data found.  Updated Vital Signs BP (!) 148/98   Pulse 81   Temp 98.9 F (37.2 C) (Oral)   Resp 16   LMP 04/02/2015 (Exact Date)   SpO2 98%   Visual Acuity Right Eye Distance:   Left Eye Distance:   Bilateral Distance:    Right Eye Near:   Left Eye Near:    Bilateral Near:     Physical Exam Vitals and nursing note reviewed.  Constitutional:      Appearance: She is well-developed.     Comments: No acute distress  HENT:     Head: Normocephalic and atraumatic.     Nose: Nose normal.  Eyes:     Conjunctiva/sclera: Conjunctivae normal.  Cardiovascular:     Rate and Rhythm: Normal rate.  Pulmonary:     Effort: Pulmonary effort is normal. No respiratory distress.  Abdominal:     General: There is no distension.  Musculoskeletal:        General: Normal range of motion.     Cervical back: Neck supple.     Comments: Nontender to palpation of cervical thoracic and lumbar spine midline, increased tenderness throughout right lumbar upper thoracic and cervical musculature  Full active range of motion of upper extremities, strength at shoulders and grip strength 5/5 ankle bilaterally Hip and knee strength 5/5 and equal bilaterally  Skin:    General: Skin is warm and dry.     Comments: No rash  Neurological:      Mental Status: She is alert and oriented to person, place, and time.      UC Treatments / Results  Labs (all labs ordered are listed, but only abnormal results are displayed) Labs Reviewed - No data to display  EKG   Radiology No results found.  Procedures Procedures (including critical care time)  Medications Ordered in UC Medications - No data to display  Initial Impression / Assessment and Plan / UC Course  I have reviewed the triage vital signs and the nursing notes.  Pertinent labs & imaging results that were available during my care of the patient were reviewed by me and considered in my medical decision making (see chart for details).     Neck and back pain x1.5 weeks, no mechanism of injury, no neuro deficits, using NSAIDs without relief, will provide prednisone taper as alternative and supplement with tizanidine, alternate ice and heat and activity modification, encouraged follow-up with sports medicine if persisting.  May benefit from physical therapy if resistant to medications.  Discussed strict return precautions. Patient verbalized understanding and is agreeable with plan.  Final Clinical Impressions(s) / UC Diagnoses   Final diagnoses:  Neck pain  Acute right-sided low back pain without sciatica     Discharge Instructions     Begin prednisone taper the next 6 days-begin with 6 tablets, decrease by 1 tablet each day until complete-6, 5, 4, 3, 2, 1-take with food and earlier in the day if possible Supplement tizanidine at home/bedtime-this is muscle relaxer, may cause drowsiness Alternate ice and heat Mild activity Follow-up with sports medicine if persisting   ED Prescriptions    Medication Sig Dispense Auth. Provider   predniSONE (DELTASONE) 10 MG tablet Begin with 6 tabs on day 1, 5 tab on day 2, 4 tab on day 3, 3 tab on day 4, 2 tab on day 5, 1 tab on day 6-take with food 21 tablet Sandara Tyree C, PA-C   tiZANidine (ZANAFLEX) 2 MG tablet Take  1-2 tablets (2-4 mg total) by mouth every 6 (six) hours as needed for muscle spasms. 30 tablet Noeh Sparacino, [place] C, PA-C     PDMP not reviewed this encounter.   Janith Lima, [place] 06/13/20 1219

## 2020-06-13 NOTE — Discharge Instructions (Signed)
Begin prednisone taper the next 6 days-begin with 6 tablets, decrease by 1 tablet each day until complete-6, 5, 4, 3, 2, 1-take with food and earlier in the day if possible Supplement tizanidine at home/bedtime-this is muscle relaxer, may cause drowsiness Alternate ice and heat Mild activity Follow-up with sports medicine if persisting

## 2020-06-13 NOTE — ED Triage Notes (Signed)
Pt presents with right side neck pain and stiffness and lower back pain that began about a week ago ,denies injury

## 2020-08-06 ENCOUNTER — Telehealth: Payer: 59 | Admitting: Family

## 2020-10-07 NOTE — Progress Notes (Signed)
Virtual Visit via Telephone Note  I connected with Kristen Hines, on 10/08/2020 at 10:06 AM by telephone due to the COVID-19 pandemic and verified that I am speaking with the correct person using two identifiers.  Due to current restrictions/limitations of in-office visits due to the COVID-19 pandemic, this scheduled clinical appointment was converted to a telehealth visit.   Consent: I discussed the limitations, risks, security and privacy concerns of performing an evaluation and management service by telephone and the availability of in person appointments. I also discussed with the patient that there may be a patient responsible charge related to this service. The patient expressed understanding and agreed to proceed.   Location of Patient: Home  Location of Provider: [facility] [place] participating in Telemedicine visit: [place], NP Elmon Else, [place]   History of Present Illness: Kristen Hines. [place] is a 40 year-old female who presents to establish care.   Current issues and/or concerns: Had partial hysterectomy in 2017. Family history of breast cancer, receiving mammogram annually. Feels like hormones are off. Endorses feeling tired and other times having good energy.   Bloating no matter what she eats and/or drinks. Trying over-the-counter Miralax and probiotic without relief. Concern for being unable to lose weight, she is exercising at least 3 to 5 days weekly.   Experiencing intermittent foot swelling. Not much relief with rest and elevation. Thinks may be related to drinking water and retaining fluid. She does not eat many salty foods.     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   No current facility-administered medications on file prior to visit.    Observations/Objective: Alert and oriented x 3. Not in acute distress. Physical examination not completed as this is a telemedicine visit.  Assessment and Plan: 1. Encounter to establish care: - Patient presents today to establish care.  - Return for annual physical examination, labs, and health maintenance. Arrive fasting meaning having no food for at least 8 hours prior to appointment. You may have only water or black coffee. Please take scheduled medications as normal.   Follow Up Instructions: Return for annual physical exam.    Patient was given clear instructions to go to Emergency Department or return to medical center if symptoms don't improve, worsen, or new problems develop.The patient verbalized understanding.  I discussed the assessment and treatment plan with the patient. The patient was provided an opportunity to ask questions and all were answered. The patient agreed with the plan and demonstrated an understanding of the instructions.   The patient was advised to call back or seek an in-person evaluation if the symptoms worsen or if the condition fails to improve as anticipated.    I provided 10 minutes total of non-face-to-face time during this encounter.   [name], NP  [facility] [place], [place] 10/08/2020, 10:06 AM

## 2020-10-08 ENCOUNTER — Encounter: Payer: Self-pay | Admitting: Family

## 2020-10-08 ENCOUNTER — Telehealth: Payer: Self-pay

## 2020-10-08 ENCOUNTER — Other Ambulatory Visit: Payer: Self-pay

## 2020-10-08 ENCOUNTER — Telehealth (INDEPENDENT_AMBULATORY_CARE_PROVIDER_SITE_OTHER): Payer: 59 | Admitting: Family

## 2020-10-08 DIAGNOSIS — Z90710 Acquired absence of both cervix and uterus: Secondary | ICD-10-CM | POA: Insufficient documentation

## 2020-10-08 DIAGNOSIS — Z7689 Persons encountering health services in other specified circumstances: Secondary | ICD-10-CM | POA: Diagnosis not present

## 2020-10-08 NOTE — Telephone Encounter (Signed)
Made contact with pt 10:00am telemedicine visit completed

## 2020-10-08 NOTE — Telephone Encounter (Signed)
Att to contact pt to start virtual telemedicine visit no ans lvm on ph: [phone] unable to lvm on alternate phone

## 2020-10-08 NOTE — Progress Notes (Signed)
Pt presents for telemedicine visit to establish care  Experiencing fatigue, weight gain/loss

## 2020-12-08 NOTE — Progress Notes (Signed)
Patient ID: Kristen Hines, female    DOB: 08-19-1980  MRN: [id number]  CC: Annual Physical Exam  Subjective: Kristen Hines is a 40 y.o. female who presents for annual physical exam.   Her concerns today include:  Chronic constipation. Eating plenty of fruits, vegetables, and drinking water. Exercising routinely. Colonoscopy around 2017 was normal. Reports would like to lose weight, goal weight 215 pounds. Has seen a nutritionist in the past and followed instructions without success. Reports she is ok with trial of Wegovy and will see how much this costs at her local pharmacy as medication does not seem covered by her health insurance plan.  Reports at least every 3 months having pain in lower back lasting 1 week then goes away. Reports prefers to return for back x-ray when actively having pain.   Reports history of high blood pressures and low blood pressures at several doctor's offices in the past. Reports was not started on blood pressure medications when had higher numbers.   Patient Active Problem List   Diagnosis Date Noted   History of total abdominal hysterectomy 10/08/2020   At high risk for breast cancer 09/14/2017   Genetic testing 07/16/2017   Family history of breast cancer    Family history of pancreatic cancer    Abnormal uterine bleeding 04/16/2015     No current outpatient medications on file prior to visit.   No current facility-administered medications on file prior to visit.    No Known Allergies  Social History   Socioeconomic History   Marital status: Single    Spouse name: Not on file   Number of children: Not on file   Years of education: Not on file   Highest education level: Not on file  Occupational History   Not on file  Tobacco Use   Smoking status: Never   Smokeless tobacco: Never  Vaping Use   Vaping Use: Never used  Substance and Sexual Activity   Alcohol use: Yes    Comment: SOCIAL   Drug use: No   Sexual activity: Not on  file  Other Topics Concern   Not on file  Social History Narrative   Not on file   Social Determinants of Health   Financial Resource Strain: Not on file  Food Insecurity: Not on file  Transportation Needs: Not on file  Physical Activity: Not on file  Stress: Not on file  Social Connections: Not on file  Intimate Partner Violence: Not on file    Family History  Problem Relation Age of Onset   Hypertension Mother    Breast cancer Mother 39       currently 65   Hypertension Father    Heart disease Father    Diabetes Father    Pancreatic cancer Maternal Aunt        diagnosed 46s; currently 78s   Prostate cancer Maternal Grandfather 41       currently 27s    Past Surgical History:  Procedure Laterality Date   ABDOMINAL HYSTERECTOMY Bilateral 04/16/2015   Procedure: HYSTERECTOMY ABDOMINAL WITH BILATERAL SALPINGECTOMY;  Surgeon: Molli Posey, MD;  Location: [place];  Service: Gynecology;  Laterality: Bilateral;   LAPAROSCOPIC VAGINAL HYSTERECTOMY WITH SALPINGECTOMY Bilateral 04/16/2015   Procedure:  ATTEMPTED LAPAROSCOPIC ASSISTED VAGINAL HYSTERECTOMY WITH BILATERAL SALPINGECTOMY;  Surgeon: Molli Posey, MD;  Location: [place];  Service: Gynecology;  Laterality: Bilateral;   LAPAROSCOPY BILATERAL TUBAL WITH FILCHIE CLIPS/   D & C HYSTEROSCOPY ENDOMETRIAL NOVASURE  ABLATION  03-01-2009    ROS: Review of Systems Negative except as stated above  PHYSICAL EXAM: BP (!) 162/103 (BP Location: Left Arm, Patient Position: Sitting, Cuff Size: Large)   Pulse 74   Temp 97.8 F (36.6 C)   Resp 16   Ht 5' 6.54" (1.69 m)   Wt 259 lb 12.8 oz (117.8 kg)   LMP 04/02/2015 (Exact Date)   SpO2 98%   BMI 41.26 kg/m    Physical Exam Exam conducted with a chaperone present.  HENT:     Head: Normocephalic and atraumatic.     Right Ear: Tympanic membrane, ear canal and external ear normal. There is no impacted cerumen.     Left Ear: Tympanic  membrane, ear canal and external ear normal.  Eyes:     Extraocular Movements: Extraocular movements intact.     Conjunctiva/sclera: Conjunctivae normal.     Pupils: Pupils are equal, round, and reactive to light.  Cardiovascular:     Rate and Rhythm: Normal rate and regular rhythm.     Pulses: Normal pulses.     Heart sounds: Normal heart sounds.  Pulmonary:     Effort: Pulmonary effort is normal.     Breath sounds: Normal breath sounds.  Chest:     Comments: Patient declined exam. Abdominal:     General: Bowel sounds are normal.     Palpations: Abdomen is soft.     Tenderness: There is abdominal tenderness.     Comments: Abdominal tenderness right lumbar region and right lower quadrant.   Genitourinary:    General: Normal vulva.     Vagina: Normal.     Cervix: Normal.     Adnexa: Right adnexa normal and left adnexa normal.     Comments: Partial hysterectomy 2017. Elmon Else, CMA present during examination.  Musculoskeletal:        General: Normal range of motion.     Cervical back: Normal range of motion and neck supple.  Skin:    General: Skin is warm and dry.     Capillary Refill: Capillary refill takes less than 2 seconds.  Neurological:     General: No focal deficit present.     Mental Status: She is alert and oriented to person, place, and time.  Psychiatric:        Mood and Affect: Mood normal.        Behavior: Behavior normal.    ASSESSMENT AND PLAN: 1. Annual physical exam: - Counseled on 150 minutes of exercise per week as tolerated, healthy eating (including decreased daily intake of saturated fats, cholesterol, added sugars, sodium), STI prevention, and routine healthcare maintenance.  2. Screening for metabolic disorder: - QZE09+QZRA to check kidney function, liver function, and electrolyte balance.  - CMP14+EGFR  3. Screening for deficiency anemia: - CBC to screen for anemia.  - CBC  4. Diabetes mellitus screening: - Hemoglobin A1c to screen for  pre-diabetes/diabetes. - Hemoglobin A1c  5. Screening cholesterol level: - Lipid panel to screen for high cholesterol.  - Lipid panel  6. Thyroid disorder screen: - TSH to check thyroid function.  - TSH  7. Need for hepatitis C screening test: - Hepatitis C antibody to screen for hepatitis C.  - Hepatitis C Antibody  8. Pap smear for cervical cancer screening: - Cytology - PAP for cervical cancer screening.  - Cytology - PAP([place])  9. Routine screening for STI (sexually transmitted infection): - Cervicovaginal self-swab to screen for chlamydia, gonorrhea, trichomonas, bacterial vaginitis, and  candida vaginitis. - Cervicovaginal ancillary only  10. Need for Tdap vaccination: - Administered today in office.  - Tdap vaccine greater than or equal to 7yo IM  11. Encounter for screening mammogram for malignant neoplasm of breast: - Referral for breast cancer screening by mammogram.  - MM Digital Screening; Future  12. Chronic constipation: - Referral to Gastroenterology for further evaluation and management. - Ambulatory referral to Gastroenterology  13. Chronic bilateral low back pain without sciatica: - Patient prefers to return at later date for diagnostic x-ray lumbar spine. Counseled patient to please call to schedule appointment with Radiology prior to visit. Patient verbalized understanding.   41. Encounter for weight management: 15. Class 3 severe obesity without serious comorbidity with body mass index (BMI) of 40.0 to 44.9 in adult, unspecified obesity type [facility]): - Begin Semaglutide-Weight Management as prescribed.  - Counseled on low-sodium, DASH diet, and 150 minutes of moderate intensity exercise per week as tolerated. Discussed medication compliance, adverse effects. - Follow-up with primary provider in 4 weeks or sooner if needed.  - Semaglutide-Weight Management (WEGOVY) 0.25 MG/0.5ML SOAJ; Inject 0.25 mg into the skin once a week.  Dispense: 2 mL; Refill:  0  16. Elevated blood pressure reading in office without diagnosis of hypertension: -Blood pressure elevated at today's visit. Patient asymptomatic without chest pressure, chest pain, palpitations, shortness of breath, lightheadedness, dizziness, and headache.   Patient was given the opportunity to ask questions.  Patient verbalized understanding of the plan and was able to repeat key elements of the plan. Patient was given clear instructions to go to Emergency Department or return to medical center if symptoms don't improve, worsen, or new problems develop.The patient verbalized understanding.   Orders Placed This Encounter  Procedures   MM Digital Screening   Hepatitis C Antibody   CBC   Lipid panel   TSH   Hemoglobin A1c   CMP14+EGFR   Ambulatory referral to Gastroenterology     Requested Prescriptions   Signed Prescriptions Disp Refills   Semaglutide-Weight Management (WEGOVY) 0.25 MG/0.5ML SOAJ 2 mL 0    Sig: Inject 0.25 mg into the skin once a week.    Return in about 1 year (around 12/13/2021) for Physical per patient preference, blood pressure check 2 weeks, weight check 4 weeks.  [name], NP

## 2020-12-13 ENCOUNTER — Other Ambulatory Visit (HOSPITAL_COMMUNITY)
Admission: RE | Admit: 2020-12-13 | Discharge: 2020-12-13 | Disposition: A | Discharge status: Home / Self Care | Payer: 59 | Source: Ambulatory Visit | Attending: Family | Admitting: Family

## 2020-12-13 ENCOUNTER — Other Ambulatory Visit: Payer: Self-pay

## 2020-12-13 ENCOUNTER — Ambulatory Visit (INDEPENDENT_AMBULATORY_CARE_PROVIDER_SITE_OTHER): Payer: 59 | Admitting: Family

## 2020-12-13 ENCOUNTER — Encounter: Payer: Self-pay | Admitting: Family

## 2020-12-13 VITALS — BP 162/103 | HR 74 | Temp 97.8°F | Resp 16 | Ht 66.54 in | Wt 259.8 lb

## 2020-12-13 DIAGNOSIS — Z0000 Encounter for general adult medical examination without abnormal findings: Secondary | ICD-10-CM | POA: Diagnosis not present

## 2020-12-13 DIAGNOSIS — Z124 Encounter for screening for malignant neoplasm of cervix: Secondary | ICD-10-CM

## 2020-12-13 DIAGNOSIS — M545 Low back pain, unspecified: Secondary | ICD-10-CM

## 2020-12-13 DIAGNOSIS — Z131 Encounter for screening for diabetes mellitus: Secondary | ICD-10-CM | POA: Diagnosis not present

## 2020-12-13 DIAGNOSIS — Z130 Encounter for screening for diseases of the blood and blood-forming organs and certain disorders involving the immune mechanism: Secondary | ICD-10-CM | POA: Diagnosis not present

## 2020-12-13 DIAGNOSIS — G8929 Other chronic pain: Secondary | ICD-10-CM

## 2020-12-13 DIAGNOSIS — Z113 Encounter for screening for infections with a predominantly sexual mode of transmission: Secondary | ICD-10-CM | POA: Insufficient documentation

## 2020-12-13 DIAGNOSIS — E66813 Obesity, class 3: Secondary | ICD-10-CM

## 2020-12-13 DIAGNOSIS — R030 Elevated blood-pressure reading, without diagnosis of hypertension: Secondary | ICD-10-CM

## 2020-12-13 DIAGNOSIS — Z13228 Encounter for screening for other metabolic disorders: Secondary | ICD-10-CM | POA: Diagnosis not present

## 2020-12-13 DIAGNOSIS — Z7689 Persons encountering health services in other specified circumstances: Secondary | ICD-10-CM

## 2020-12-13 DIAGNOSIS — K5909 Other constipation: Secondary | ICD-10-CM

## 2020-12-13 DIAGNOSIS — Z1231 Encounter for screening mammogram for malignant neoplasm of breast: Secondary | ICD-10-CM

## 2020-12-13 DIAGNOSIS — Z23 Encounter for immunization: Secondary | ICD-10-CM

## 2020-12-13 DIAGNOSIS — Z1159 Encounter for screening for other viral diseases: Secondary | ICD-10-CM

## 2020-12-13 DIAGNOSIS — Z13220 Encounter for screening for lipoid disorders: Secondary | ICD-10-CM

## 2020-12-13 DIAGNOSIS — Z6841 Body Mass Index (BMI) 40.0 and over, adult: Secondary | ICD-10-CM

## 2020-12-13 DIAGNOSIS — Z1329 Encounter for screening for other suspected endocrine disorder: Secondary | ICD-10-CM

## 2020-12-13 MED ORDER — WEGOVY 0.25 MG/0.5ML ~~LOC~~ SOAJ: 0.2500 mg | SUBCUTANEOUS | 0 refills | Status: DC

## 2020-12-13 NOTE — Progress Notes (Signed)
Pt presents for annual physical exam w/pap, declines breast exam, desires STD screening  Declined Tdap vaccine

## 2020-12-13 NOTE — Patient Instructions (Signed)
Preventive Care 40-42 Years Old, Female Preventive care refers to lifestyle choices and visits with your health care provider that can promote health and wellness. This includes: A yearly physical exam. This is also called an annual wellness visit. Regular dental and eye exams. Immunizations. Screening for certain conditions. Healthy lifestyle choices, such as: Eating a healthy diet. Getting regular exercise. Not using drugs or products that contain nicotine and tobacco. Limiting alcohol use. What can I expect for my preventive care visit? Physical exam Your health care provider will check your: Height and weight. These may be used to calculate your BMI (body mass index). BMI is a measurement that tells if you are at a healthy weight. Heart rate and blood pressure. Body temperature. Skin for abnormal spots. Counseling Your health care provider may ask you questions about your: Past medical problems. Family's medical history. Alcohol, tobacco, and drug use. Emotional well-being. Home life and relationship well-being. Sexual activity. Diet, exercise, and sleep habits. Work and work environment. Access to firearms. Method of birth control. Menstrual cycle. Pregnancy history. What immunizations do I need? Vaccines are usually given at various ages, according to a schedule. Your health care provider will recommend vaccines for you based on your age, medical history, and lifestyle or other factors, such as travel or where you work. What tests do I need? Blood tests Lipid and cholesterol levels. These may be checked every 5 years, or more often if you are over 50 years old. Hepatitis C test. Hepatitis B test. Screening Lung cancer screening. You may have this screening every year starting at age 55 if you have a 30-pack-year history of smoking and currently smoke or have quit within the past 15 years. Colorectal cancer screening. All adults should have this screening starting at  age 50 and continuing until age 75. Your health care provider may recommend screening at age 45 if you are at increased risk. You will have tests every 1-10 years, depending on your results and the type of screening test. Diabetes screening. This is done by checking your blood sugar (glucose) after you have not eaten for a while (fasting). You may have this done every 1-3 years. Mammogram. This may be done every 1-2 years. Talk with your health care provider about when you should start having regular mammograms. This may depend on whether you have a family history of breast cancer. BRCA-related cancer screening. This may be done if you have a family history of breast, ovarian, tubal, or peritoneal cancers. Pelvic exam and Pap test. This may be done every 3 years starting at age 21. Starting at age 30, this may be done every 5 years if you have a Pap test in combination with an HPV test. Other tests STD (sexually transmitted disease) testing, if you are at risk. Bone density scan. This is done to screen for osteoporosis. You may have this scan if you are at high risk for osteoporosis. Talk with your health care provider about your test results, treatment options, and if necessary, the need for more tests. Follow these instructions at home: Eating and drinking  Eat a diet that includes fresh fruits and vegetables, whole grains, lean protein, and low-fat dairy products. Take vitamin and mineral supplements as recommended by your health care provider. Do not drink alcohol if: Your health care provider tells you not to drink. You are pregnant, may be pregnant, or are planning to become pregnant. If you drink alcohol: Limit how much you have to 0-1 drink a day. Be   aware of how much alcohol is in your drink. In the [place] one drink equals one 12 oz bottle of beer (355 mL), one 5 oz glass of wine (148 mL), or one 1 oz glass of hard liquor (44 mL). Lifestyle Take daily care of your teeth and  gums. Brush your teeth every morning and night with fluoride toothpaste. Floss one time each day. Stay active. Exercise for at least 30 minutes 5 or more days each week. Do not use any products that contain nicotine or tobacco, such as cigarettes, e-cigarettes, and chewing tobacco. If you need help quitting, ask your health care provider. Do not use drugs. If you are sexually active, practice safe sex. Use a condom or other form of protection to prevent STIs (sexually transmitted infections). If you do not wish to become pregnant, use a form of birth control. If you plan to become pregnant, see your health care provider for a prepregnancy visit. If told by your health care provider, take low-dose aspirin daily starting at age 63. Find healthy ways to cope with stress, such as: Meditation, yoga, or listening to music. Journaling. Talking to a trusted person. Spending time with friends and family. Safety Always wear your seat belt while driving or riding in a vehicle. Do not drive: If you have been drinking alcohol. Do not ride with someone who has been drinking. When you are tired or distracted. While texting. Wear a helmet and other protective equipment during sports activities. If you have firearms in your house, make sure you follow all gun safety procedures. What's next? Visit your health care provider once a year for an annual wellness visit. Ask your health care provider how often you should have your eyes and teeth checked. Stay up to date on all vaccines. This information is not intended to replace advice given to you by your health care provider. Make sure you discuss any questions you have with your health care provider. Document Revised: 05/17/2020 Document Reviewed: 11/18/2017 Elsevier Patient Education  2022 [employer].

## 2020-12-14 ENCOUNTER — Encounter: Payer: Self-pay | Admitting: Family

## 2020-12-14 DIAGNOSIS — R7303 Prediabetes: Secondary | ICD-10-CM | POA: Insufficient documentation

## 2020-12-14 LAB — CMP14+EGFR
ALT: 6 IU/L (ref 0–32)
AST: 10 IU/L (ref 0–40)
Albumin/Globulin Ratio: 1.5 (ref 1.2–2.2)
Albumin: 4.1 g/dL (ref 3.8–4.8)
Alkaline Phosphatase: 72 IU/L (ref 44–121)
BUN/Creatinine Ratio: 11 (ref 9–23)
BUN: 10 mg/dL (ref 6–24)
Bilirubin Total: 0.3 mg/dL (ref 0.0–1.2)
CO2: 24 mmol/L (ref 20–29)
Calcium: 8.8 mg/dL (ref 8.7–10.2)
Chloride: 99 mmol/L (ref 96–106)
Creatinine, Ser: 0.9 mg/dL (ref 0.57–1.00)
Globulin, Total: 2.7 g/dL (ref 1.5–4.5)
Glucose: 77 mg/dL (ref 65–99)
Potassium: 4.4 mmol/L (ref 3.5–5.2)
Sodium: 136 mmol/L (ref 134–144)
Total Protein: 6.8 g/dL (ref 6.0–8.5)
eGFR: 82 mL/min/{1.73_m2} (ref >59)

## 2020-12-14 LAB — LIPID PANEL
Chol/HDL Ratio: 3.8 ratio (ref 0.0–4.4)
Cholesterol, Total: 211 mg/dL — ABNORMAL HIGH (ref 100–199)
HDL: 55 mg/dL (ref >39)
LDL Chol Calc (NIH): 137 mg/dL — ABNORMAL HIGH (ref 0–99)
Triglycerides: 104 mg/dL (ref 0–149)
VLDL Cholesterol Cal: 19 mg/dL (ref 5–40)

## 2020-12-14 LAB — HEMOGLOBIN A1C
Est. average glucose Bld gHb Est-mCnc: 120 mg/dL
Hgb A1c MFr Bld: 5.8 % — ABNORMAL HIGH (ref 4.8–5.6)

## 2020-12-14 LAB — CBC
Hematocrit: 35.5 % (ref 34.0–46.6)
Hemoglobin: 11.3 g/dL (ref 11.1–15.9)
MCH: 25.5 pg — ABNORMAL LOW (ref 26.6–33.0)
MCHC: 31.8 g/dL (ref 31.5–35.7)
MCV: 80 fL (ref 79–97)
Platelets: 242 10*3/uL (ref 150–450)
RBC: 4.43 x10E6/uL (ref 3.77–5.28)
RDW: 13.6 % (ref 11.7–15.4)
WBC: 5.8 10*3/uL (ref 3.4–10.8)

## 2020-12-14 LAB — HEPATITIS C ANTIBODY: Hep C Virus Ab: <0.1 s/co ratio (ref 0.0–0.9)

## 2020-12-14 LAB — TSH: TSH: 2.24 u[IU]/mL (ref 0.450–4.500)

## 2020-12-14 NOTE — Progress Notes (Signed)
Kidney function normal.   Liver function normal.   Thyroid function normal.   No anemia.   Hepatitis C negative.   Hemoglobin A1c is consistent with pre-diabetes. Practice healthy eating habits of fresh fruit and vegetables, lean baked meats such as chicken, fish, and [place]; limit breads, rice, pastas, and desserts; practice regular aerobic exercise (at least 150 minutes a week as tolerated). No medication needed at the moment. Encouraged to recheck in 6 months.   Cholesterol higher than expected. High cholesterol may increase risk of heart attack and/or stroke. Consider eating more fruits, vegetables, and lean baked meats such as chicken or fish. Moderate intensity exercise at least 150 minutes as tolerated per week may help as well. However, your risk of heart attack/stroke in ten years is low so does not need to start a medication at the moment. Encouraged to recheck in 3 to 6 months.   The following is for provider reference only: The 10-year ASCVD risk score (Arnett DK, et al., 2019) is: 2.5%   Values used to calculate the score:     Age: 40 years     Sex: Female     Is Non-Hispanic African American: Yes     Diabetic: No     Tobacco smoker: No     Systolic Blood Pressure: 976 mmHg     Is BP treated: No     HDL Cholesterol: 55 mg/dL     Total Cholesterol: 211 mg/dL

## 2020-12-16 ENCOUNTER — Other Ambulatory Visit: Payer: Self-pay | Admitting: Family

## 2020-12-16 DIAGNOSIS — B9689 Other specified bacterial agents as the cause of diseases classified elsewhere: Secondary | ICD-10-CM

## 2020-12-16 DIAGNOSIS — N760 Acute vaginitis: Secondary | ICD-10-CM

## 2020-12-16 LAB — CERVICOVAGINAL ANCILLARY ONLY
Bacterial Vaginitis (gardnerella): POSITIVE — AB
Chlamydia: NEGATIVE
Comment: NEGATIVE
Comment: NEGATIVE
Comment: NEGATIVE
Comment: NORMAL
Neisseria Gonorrhea: NEGATIVE
Trichomonas: NEGATIVE

## 2020-12-16 MED ORDER — METRONIDAZOLE 500 MG PO TABS: 500.0000 mg | ORAL_TABLET | Freq: Two times a day (BID) | ORAL | 0 refills | Status: AC

## 2020-12-16 NOTE — Progress Notes (Signed)
Chlamydia, Gonorrhea, and Trichomonas negative.   Positive for Bacterial Vaginitis, an overgrowth of normal bacteria in the vagina due to changes in pH. Prescribed Metronidazole (Flagyl) twice per day for 7 days. Do not drink alcohol while taking this medication.

## 2020-12-18 LAB — CYTOLOGY - PAP
Adequacy: ABSENT
Comment: NEGATIVE
Diagnosis: NEGATIVE
High risk HPV: NEGATIVE

## 2020-12-18 NOTE — Progress Notes (Signed)
PAP negative for lesion and malignancy. Repeat PAP in 3 years or sooner if needed.   HPV negative.

## 2020-12-27 ENCOUNTER — Encounter: Payer: Self-pay | Admitting: Gastroenterology

## 2021-01-01 ENCOUNTER — Encounter: Payer: Self-pay | Admitting: Gastroenterology

## 2021-01-01 ENCOUNTER — Ambulatory Visit (INDEPENDENT_AMBULATORY_CARE_PROVIDER_SITE_OTHER): Payer: 59 | Admitting: Gastroenterology

## 2021-01-01 VITALS — BP 130/80 | HR 59 | Ht 66.0 in | Wt 260.0 lb

## 2021-01-01 DIAGNOSIS — K5900 Constipation, unspecified: Secondary | ICD-10-CM | POA: Diagnosis not present

## 2021-01-01 MED ORDER — POLYETHYLENE GLYCOL 3350 17 GM/SCOOP PO POWD: 1.0000 | Freq: Every day | ORAL | 3 refills | Status: AC

## 2021-01-01 NOTE — Progress Notes (Signed)
HPI : Kristen Hines is a very pleasant 40 year old female with a history of obesity who is referred to [place] by Durene Fruits, NP for further evaluation and management of chronic constipation.  The patient states that she started having problems with constipation a year or 2 ago.  She has never had daily bowel movements, usually just 2-3 bowel movements per week, but for the past 1 to 2 years her bowel movement frequency has continued to decrease.  Currently, she will usually go a week or sometimes 2 weeks between bowel movements.  She sometimes has straining with bowel movements, but she denies sitting on the toilet for prolonged periods.  Her stools vary in consistency, but are frequently hard and small in volume.  Diarrhea is never a problem for her.  She only has abdominal pain and nausea when she goes prolonged periods without a bowel movement.  No blood in her stool.  No unintentional weight loss, rather she has continued to gain weight. She denies any changes in her diet or medications that accompanied the worsening of her constipation.  She recently had normal thyroid and chemistry labs.  She has previously tried taking fiber supplements which made her gassy.  She is also taken MiraLAX which does work well as long as she takes it. She had a colonoscopy reportedly in 2017.  The patient is unclear why she had that procedure but says it was around the same time she had her hysterectomy.    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;  Service: Gynecology;  Laterality: Bilateral;   LAPAROSCOPIC VAGINAL HYSTERECTOMY WITH SALPINGECTOMY Bilateral 04/16/2015   Procedure:  ATTEMPTED LAPAROSCOPIC ASSISTED VAGINAL HYSTERECTOMY WITH BILATERAL SALPINGECTOMY;  Surgeon: Molli Posey, MD;  Location: [place];  Service: Gynecology;  Laterality: Bilateral;   LAPAROSCOPY BILATERAL TUBAL WITH FILCHIE CLIPS/   D & C HYSTEROSCOPY ENDOMETRIAL NOVASURE ABLATION  03-01-2009   Family History  Problem Relation Age of Onset   Hypertension Mother    Breast cancer Mother 34       currently 29   Hypertension Father    Heart disease Father    Diabetes Father    Pancreatic cancer Maternal Aunt        diagnosed 47s; currently 32s   Prostate cancer Maternal Grandfather 55       currently 20s   Social History   Tobacco Use   Smoking status: Never   Smokeless tobacco: Never  Vaping Use   Vaping Use: Never used  Substance Use Topics   Alcohol use: Yes    Comment: SOCIAL   Drug use: No   Current Outpatient Medications  Medication Sig Dispense Refill   Semaglutide-Weight Management (WEGOVY) 0.25 MG/0.5ML SOAJ Inject 0.25 mg into the skin once a week. 2 mL 0   No current facility-administered medications for this visit.   No Known Allergies   Review of Systems: All systems reviewed  and negative except where noted in HPI.    No results found.  Physical Exam: Ht 5' 6"  (1.676 m)   Wt 260 lb (117.9 kg)   LMP 04/02/2015 (Exact Date)   BMI 41.97 kg/m  Constitutional: Pleasant,well-developed, African-American female in no acute distress. HEENT: Normocephalic and atraumatic. Conjunctivae are normal. No scleral icterus. Cardiovascular: Normal rate, regular rhythm.  Pulmonary/chest: Effort normal and breath sounds normal. No wheezing, rales or rhonchi. Abdominal: Soft, nondistended, nontender. Bowel sounds active throughout. There are no masses palpable. No  hepatomegaly. Extremities: no edema Neurological: Alert and oriented to person place and time. Skin: Skin is warm and dry. No rashes noted. Psychiatric: Normal mood and affect. Behavior is normal.  CBC    Component Value Date/Time   WBC 5.8 12/13/2020 1028   WBC 5.6 06/01/2019 1026   RBC 4.43 12/13/2020 1028   RBC 4.25 06/01/2019 1026   HGB 11.3 12/13/2020 1028   HCT 35.5 12/13/2020 1028   PLT 242 12/13/2020 1028   MCV 80 12/13/2020 1028   MCH 25.5 (L) 12/13/2020 1028   MCH 26.1 06/01/2019 1026   MCHC 31.8 12/13/2020 1028   MCHC 31.4 06/01/2019 1026   RDW 13.6 12/13/2020 1028    CMP     Component Value Date/Time   NA 136 12/13/2020 1028   K 4.4 12/13/2020 1028   CL 99 12/13/2020 1028   CO2 24 12/13/2020 1028   GLUCOSE 77 12/13/2020 1028   GLUCOSE 92 06/01/2019 1026   BUN 10 12/13/2020 1028   CREATININE 0.90 12/13/2020 1028   CREATININE 0.67 05/12/2012 1413   CALCIUM 8.8 12/13/2020 1028   PROT 6.8 12/13/2020 1028   ALBUMIN 4.1 12/13/2020 1028   AST 10 12/13/2020 1028   ALT 6 12/13/2020 1028   ALKPHOS 72 12/13/2020 1028   BILITOT 0.3 12/13/2020 1028   GFRNONAA >60 06/01/2019 1026   GFRAA >60 06/01/2019 1026     ASSESSMENT AND PLAN: 40 year old female with chronic constipation with no red flag symptoms.  Normal TSH and electrolytes, not taking any medications that would worsen her constipation.  Patient does not endorse significant straining with defecation, so low suspicion for pelvic floor dyssynergia.  I suspect the patient has chronic idiopathic constipation.  I recommended she continue to take MiraLAX daily, as this has worked for her in the past.  I recommend she continue to engage in high-fiber diet, drink plenty of water, and exercise regularly, as all these things may help with her constipation.  She was advised that she may continue to be reliant on medications to help her have regular bowel movements, but this was okay. No indication for a repeat colonoscopy  at this time.  She will need a colonoscopy at age 51 or in 2027 if her previous colonoscopy report can be obtained and confirmed to be normal.  Constipation, idiopathic - MiraLAX daily - High-fiber diet, adequate water intake, exercise  Zavia Pullen E. Candis Schatz, MD [facility]   [name], NP

## 2021-01-01 NOTE — Patient Instructions (Addendum)
If you are age 42 or older, your body mass index should be between 23-30. Your Body mass index is 41.97 kg/m. If this is out of the aforementioned range listed, please consider follow up with your Primary Care Provider.  If you are age 26 or younger, your body mass index should be between 19-25. Your Body mass index is 41.97 kg/m. If this is out of the aformentioned range listed, please consider follow up with your Primary Care Provider.   We have sent the following medications to your pharmacy for you to pick up at your convenience: Miralax 1 capful daily.   The [facility] providers would like to encourage you to use [facility] to communicate with providers for non-urgent requests or questions.  Due to long hold times on the telephone, sending your provider a message by [facility] may be a faster and more efficient way to get a response.  Please allow 48 business hours for a response.  Please remember that this is for non-urgent requests.    It was a pleasure to see you today!  Thank you for trusting me with your gastrointestinal care!    Scott E.Candis Schatz, MD

## 2021-01-03 NOTE — Progress Notes (Signed)
Patient ID: Kristen Hines, female    DOB: 05/13/1980  MRN: [id number]  CC: Blood Pressure Check  Subjective: Kristen Hines is a 40 y.o. female who presents for blood pressure check.   Her concerns today include:  BLOOD PRESSURE FOLLOW-UP: 12/13/2020: -Blood pressure elevated at today's visit.  01/10/2021: Home Monitoring?: [x]  Yes    []  No Monitoring Frequency: []  Yes    [x]  No SOB? []  Yes    [x]  No Chest Pain?: []  Yes    [x]  No  2. STOMACH PAIN: Lower right stomach pain. Denies nausea and vomiting. Thinks related to right ovarian cyst. Reports in the past prescribed Ibuprofen for the same. Currently taking over-the-counter Ibuprofen.Followed by Gynecology and planning to schedule appointment soon. Seen recently by Gastroenterology for constipation and prescribed Miralax.  Patient Active Problem List   Diagnosis Date Noted   Essential hypertension 01/11/2021   Bacterial vaginitis 12/16/2020   Prediabetes 12/14/2020   History of total abdominal hysterectomy 10/08/2020   At high risk for breast cancer 09/14/2017   Genetic testing 07/16/2017   Family history of breast cancer    Family history of pancreatic cancer    Abnormal uterine bleeding 04/16/2015     Current Outpatient Medications on File Prior to Visit  Medication Sig Dispense Refill   polyethylene glycol powder (GLYCOLAX/MIRALAX) 17 GM/SCOOP powder Take 255 g by mouth daily. 255 g 3   Semaglutide-Weight Management (WEGOVY) 0.25 MG/0.5ML SOAJ Inject 0.25 mg into the skin once a week. (Patient not taking: No sig reported) 2 mL 0   No current facility-administered medications on file prior to visit.    No Known Allergies  Social History   Socioeconomic History   Marital status: Single    Spouse name: Not on file   Number of children: Not on file   Years of education: Not on file   Highest education level: Not on file  Occupational History   Not on file  Tobacco Use   Smoking status: Never   Smokeless  tobacco: Never  Vaping Use   Vaping Use: Never used  Substance and Sexual Activity   Alcohol use: Yes    Comment: SOCIAL   Drug use: No   Sexual activity: Yes    Birth control/protection: Surgical  Other Topics Concern   Not on file  Social History Narrative   Not on file   Social Determinants of Health   Financial Resource Strain: Not on file  Food Insecurity: Not on file  Transportation Needs: Not on file  Physical Activity: Not on file  Stress: Not on file  Social Connections: Not on file  Intimate Partner Violence: Not on file    Family History  Problem Relation Age of Onset   Hypertension Mother    Breast cancer Mother 51       currently 56   Hypertension Father    Heart disease Father    Diabetes Father    Pancreatic cancer Maternal Aunt        diagnosed 75s; currently 28s   Prostate cancer Maternal Grandfather 55       currently 97s    Past Surgical History:  Procedure Laterality Date   ABDOMINAL HYSTERECTOMY Bilateral 04/16/2015   Procedure: HYSTERECTOMY ABDOMINAL WITH BILATERAL SALPINGECTOMY;  Surgeon: Molli Posey, MD;  Location: [place];  Service: Gynecology;  Laterality: Bilateral;   LAPAROSCOPIC VAGINAL HYSTERECTOMY WITH SALPINGECTOMY Bilateral 04/16/2015   Procedure:  ATTEMPTED LAPAROSCOPIC ASSISTED VAGINAL HYSTERECTOMY WITH BILATERAL SALPINGECTOMY;  Surgeon:  Molli Posey, MD;  Location: [facility];  Service: Gynecology;  Laterality: Bilateral;   LAPAROSCOPY BILATERAL TUBAL WITH FILCHIE CLIPS/   D & C HYSTEROSCOPY ENDOMETRIAL NOVASURE ABLATION  03-01-2009    ROS: Review of Systems Negative except as stated above  PHYSICAL EXAM: BP (!) 164/96   Pulse 75   Temp 98.4 F (36.9 C) (Oral)   Resp 18   Ht 5\' 6"  (1.676 m)   Wt 255 lb 12.8 oz (116 kg)   LMP 04/02/2015 (Exact Date)   BMI 41.29 kg/m   Physical Exam HENT:     Head: Normocephalic and atraumatic.  Eyes:     Extraocular Movements: Extraocular  movements intact.     Conjunctiva/sclera: Conjunctivae normal.     Pupils: Pupils are equal, round, and reactive to light.  Cardiovascular:     Rate and Rhythm: Normal rate and regular rhythm.     Pulses: Normal pulses.     Heart sounds: Normal heart sounds.  Pulmonary:     Effort: Pulmonary effort is normal.     Breath sounds: Normal breath sounds.  Abdominal:     General: Bowel sounds are normal.     Palpations: Abdomen is soft.  Musculoskeletal:     Cervical back: Normal range of motion and neck supple.  Neurological:     General: No focal deficit present.     Mental Status: She is alert and oriented to person, place, and time.  Psychiatric:        Mood and Affect: Mood normal.        Behavior: Behavior normal.    ASSESSMENT AND PLAN: 1. Essential hypertension: - Newly diagnosed.  - Blood pressure not at goal during today's visit. Patient asymptomatic without chest pressure, chest pain, palpitations, shortness of breath, worst headache of life, and any additional red flag symptoms. - Begin Lisinopril as prescribed.  - Begin Hydrochlorothiazide as prescribed.  - Plan to obtain BMP in 2 weeks. - Counseled on blood pressure goal of less than 130/80, low-sodium, DASH diet, medication compliance, 150 minutes of moderate intensity exercise per week as tolerated. Discussed medication compliance, adverse effects. - Follow-up in 2 weeks or sooner if needed with primary provider.  - lisinopril (ZESTRIL) 10 MG tablet; Take 1 tablet (10 mg total) by mouth daily.  Dispense: 30 tablet; Refill: 0 - hydrochlorothiazide (HYDRODIURIL) 12.5 MG tablet; Take 1 tablet (12.5 mg total) by mouth daily.  Dispense: 30 tablet; Refill: 0  2. Right lower quadrant abdominal pain: - Patient declined imaging.  - Keep all scheduled appointments with Gynecology.  - Keep all scheduled appointments with Gastroenterology.  - Follow-up with primary provider as scheduled.   Patient was given the opportunity to  ask questions.  Patient verbalized understanding of the plan and was able to repeat key elements of the plan. Patient was given clear instructions to go to Emergency Department or return to medical center if symptoms don't improve, worsen, or new problems develop.The patient verbalized understanding.   Requested Prescriptions   Signed Prescriptions Disp Refills   lisinopril (ZESTRIL) 10 MG tablet 30 tablet 0    Sig: Take 1 tablet (10 mg total) by mouth daily.   hydrochlorothiazide (HYDRODIURIL) 12.5 MG tablet 30 tablet 0    Sig: Take 1 tablet (12.5 mg total) by mouth daily.    Return in about 2 weeks (around 01/24/2021) for Follow-Up or next available hypertension.  [name], NP

## 2021-01-10 ENCOUNTER — Ambulatory Visit (INDEPENDENT_AMBULATORY_CARE_PROVIDER_SITE_OTHER): Payer: 59 | Admitting: Family

## 2021-01-10 ENCOUNTER — Other Ambulatory Visit: Payer: Self-pay

## 2021-01-10 ENCOUNTER — Encounter: Payer: Self-pay | Admitting: Family

## 2021-01-10 VITALS — BP 164/96 | HR 75 | Temp 98.4°F | Resp 18 | Ht 66.0 in | Wt 255.8 lb

## 2021-01-10 DIAGNOSIS — I10 Essential (primary) hypertension: Secondary | ICD-10-CM | POA: Diagnosis not present

## 2021-01-10 DIAGNOSIS — R1031 Right lower quadrant pain: Secondary | ICD-10-CM

## 2021-01-10 MED ORDER — LISINOPRIL 10 MG PO TABS: 10.0000 mg | ORAL_TABLET | Freq: Every day | ORAL | 0 refills | Status: DC

## 2021-01-10 MED ORDER — HYDROCHLOROTHIAZIDE 12.5 MG PO TABS: 12.5000 mg | ORAL_TABLET | Freq: Every day | ORAL | 0 refills | Status: DC

## 2021-01-10 NOTE — Progress Notes (Signed)
BP elevated at last visit.  Having lower right side abdominal pain.requesting pain medication.

## 2021-01-11 DIAGNOSIS — I10 Essential (primary) hypertension: Secondary | ICD-10-CM | POA: Insufficient documentation

## 2021-01-20 ENCOUNTER — Telehealth: Payer: Self-pay | Admitting: *Deleted

## 2021-01-20 NOTE — Telephone Encounter (Signed)
Prior Authorization for [employer] inj 0.25mg  was denied.  Case number- [id number]

## 2021-01-24 NOTE — Progress Notes (Signed)
Patient ID: VY BADLEY, female    DOB: 04-29-1980  MRN: [id number]  CC: Hypertension Follow-Up   Subjective: Kristen Hines is a 40 y.o. female who presents for hypertension follow-up.   Her concerns today include:   HYPERTENSION FOLLOW-UP: 01/10/2021: - Newly diagnosed.  - Begin Lisinopril as prescribed.  - Begin Hydrochlorothiazide as prescribed.  - Plan to obtain BMP in 2 weeks.  01/27/2021: Doing well on current regimen. No side effects. No issues/concerns. Denies chest pain and shortness of breath. Home blood pressures 120's-130's/70's-80's.  2. WEIGHT MANAGEMENT FOLLOW-UP: Would like to try oral medication for weight loss. Health insurance does not cover [phone]. Goal to lose at least 40 pounds. She is monitoring what she eats and exercising at the gym sometimes.    Patient Active Problem List   Diagnosis Date Noted   Essential hypertension 01/11/2021   Bacterial vaginitis 12/16/2020   Prediabetes 12/14/2020   History of total abdominal hysterectomy 10/08/2020   At high risk for breast cancer 09/14/2017   Genetic testing 07/16/2017   Family history of breast cancer    Family history of pancreatic cancer    Abnormal uterine bleeding 04/16/2015     Current Outpatient Medications on File Prior to Visit  Medication Sig Dispense Refill   polyethylene glycol powder (GLYCOLAX/MIRALAX) 17 GM/SCOOP powder Take 255 g by mouth daily. 255 g 3   Semaglutide-Weight Management (WEGOVY) 0.25 MG/0.5ML SOAJ Inject 0.25 mg into the skin once a week. (Patient not taking: No sig reported) 2 mL 0   No current facility-administered medications on file prior to visit.    No Known Allergies  Social History   Socioeconomic History   Marital status: Single    Spouse name: Not on file   Number of children: Not on file   Years of education: Not on file   Highest education level: Not on file  Occupational History   Not on file  Tobacco Use   Smoking status: Never    Smokeless tobacco: Never  Vaping Use   Vaping Use: Never used  Substance and Sexual Activity   Alcohol use: Yes    Comment: SOCIAL   Drug use: No   Sexual activity: Yes    Birth control/protection: Surgical  Other Topics Concern   Not on file  Social History Narrative   Not on file   Social Determinants of Health   Financial Resource Strain: Not on file  Food Insecurity: Not on file  Transportation Needs: Not on file  Physical Activity: Not on file  Stress: Not on file  Social Connections: Not on file  Intimate Partner Violence: Not on file    Family History  Problem Relation Age of Onset   Hypertension Mother    Breast cancer Mother 45       currently 81   Hypertension Father    Heart disease Father    Diabetes Father    Pancreatic cancer Maternal Aunt        diagnosed 88s; currently 33s   Prostate cancer Maternal Grandfather 22       currently 21s    Past Surgical History:  Procedure Laterality Date   ABDOMINAL HYSTERECTOMY Bilateral 04/16/2015   Procedure: HYSTERECTOMY ABDOMINAL WITH BILATERAL SALPINGECTOMY;  Surgeon: Molli Posey, MD;  Location: [facility];  Service: Gynecology;  Laterality: Bilateral;   LAPAROSCOPIC VAGINAL HYSTERECTOMY WITH SALPINGECTOMY Bilateral 04/16/2015   Procedure:  ATTEMPTED LAPAROSCOPIC ASSISTED VAGINAL HYSTERECTOMY WITH BILATERAL SALPINGECTOMY;  Surgeon: Molli Posey, MD;  Location: [facility];  Service: Gynecology;  Laterality: Bilateral;   LAPAROSCOPY BILATERAL TUBAL WITH FILCHIE CLIPS/   D & C HYSTEROSCOPY ENDOMETRIAL NOVASURE ABLATION  03-01-2009    ROS: Review of Systems Negative except as stated above  PHYSICAL EXAM: BP 128/86 (BP Location: Left Arm, Patient Position: Sitting, Cuff Size: Large)   Pulse 67   Temp 98.5 F (36.9 C)   Resp 15   Ht 5' 5.98" (1.676 m)   Wt 248 lb 6.4 oz (112.7 kg)   LMP 04/02/2015 (Exact Date)   SpO2 98%   BMI 40.11 kg/m   Physical Exam HENT:      Head: Normocephalic and atraumatic.  Eyes:     Extraocular Movements: Extraocular movements intact.     Conjunctiva/sclera: Conjunctivae normal.     Pupils: Pupils are equal, round, and reactive to light.  Cardiovascular:     Rate and Rhythm: Normal rate and regular rhythm.     Pulses: Normal pulses.     Heart sounds: Normal heart sounds.  Pulmonary:     Effort: Pulmonary effort is normal.     Breath sounds: Normal breath sounds.  Musculoskeletal:     Cervical back: Normal range of motion and neck supple.  Neurological:     General: No focal deficit present.     Mental Status: She is alert and oriented to person, place, and time.  Psychiatric:        Mood and Affect: Mood normal.        Behavior: Behavior normal.    ASSESSMENT AND PLAN: 1. Essential hypertension: - Continue Hydrochlorothiazide and Lisinopril as prescribed.  - Counseled on blood pressure goal of less than 130/80, low-sodium, DASH diet, medication compliance, 150 minutes of moderate intensity exercise per week as tolerated. Discussed medication compliance, adverse effects. - BMP to evaluate kidney function and electrolyte balance. - Follow-up with primary provider in 3 months or sooner if needed.  - Basic Metabolic Panel - hydrochlorothiazide (HYDRODIURIL) 12.5 MG tablet; Take 1 tablet (12.5 mg total) by mouth daily.  Dispense: 90 tablet; Refill: 0 - lisinopril (ZESTRIL) 10 MG tablet; Take 1 tablet (10 mg total) by mouth daily.  Dispense: 90 tablet; Refill: 0  2. Encounter for weight management: - Begin Phentermine as prescribed.  - Patient encouraged to monitor blood pressure and to notify me if trending up. Advised to let me know if she has any palpitations. Patient agreeable. - Counseled patient that Phentermine is not intended for long-term use and that a medication holiday will need to begin after 3 months of taking medication. Patient verbalized understanding.  - I did check the [facility] prescription  drug database and found no frequent prescribers of opiates or evidence of aberrant behavior. - Follow-up with primary provider in 4 weeks or sooner if needed.  - phentermine 15 MG capsule; Take 1 capsule (15 mg total) by mouth every morning.  Dispense: 30 capsule; Refill: 0  3. Need for Tdap vaccination: - Administered today in office.  - Tdap vaccine greater than or equal to 7yo IM  Patient was given the opportunity to ask questions.  Patient verbalized understanding of the plan and was able to repeat key elements of the plan. Patient was given clear instructions to go to Emergency Department or return to medical center if symptoms don't improve, worsen, or new problems develop.The patient verbalized understanding.   Orders Placed This Encounter  Procedures   Tdap vaccine greater than or equal to 7yo IM  Basic Metabolic Panel     Requested Prescriptions   Signed Prescriptions Disp Refills   hydrochlorothiazide (HYDRODIURIL) 12.5 MG tablet 90 tablet 0    Sig: Take 1 tablet (12.5 mg total) by mouth daily.   lisinopril (ZESTRIL) 10 MG tablet 90 tablet 0    Sig: Take 1 tablet (10 mg total) by mouth daily.   phentermine 15 MG capsule 30 capsule 0    Sig: Take 1 capsule (15 mg total) by mouth every morning.    Follow-up with primary provider in 3 months or sooner if needed for hypertension.  [name], NP

## 2021-01-27 ENCOUNTER — Encounter: Payer: Self-pay | Admitting: Family

## 2021-01-27 ENCOUNTER — Other Ambulatory Visit: Payer: Self-pay

## 2021-01-27 ENCOUNTER — Ambulatory Visit (INDEPENDENT_AMBULATORY_CARE_PROVIDER_SITE_OTHER): Payer: 59 | Admitting: Family

## 2021-01-27 VITALS — BP 128/86 | HR 67 | Temp 98.5°F | Resp 15 | Ht 65.98 in | Wt 248.4 lb

## 2021-01-27 DIAGNOSIS — I10 Essential (primary) hypertension: Secondary | ICD-10-CM

## 2021-01-27 DIAGNOSIS — Z7689 Persons encountering health services in other specified circumstances: Secondary | ICD-10-CM

## 2021-01-27 DIAGNOSIS — Z23 Encounter for immunization: Secondary | ICD-10-CM

## 2021-01-27 MED ORDER — LISINOPRIL 10 MG PO TABS: 10.0000 mg | ORAL_TABLET | Freq: Every day | ORAL | 0 refills | Status: DC

## 2021-01-27 MED ORDER — HYDROCHLOROTHIAZIDE 12.5 MG PO TABS: 12.5000 mg | ORAL_TABLET | Freq: Every day | ORAL | 0 refills | Status: DC

## 2021-01-27 MED ORDER — PHENTERMINE HCL 15 MG PO CAPS: 15.0000 mg | ORAL_CAPSULE | ORAL | 0 refills | Status: DC

## 2021-01-27 NOTE — Progress Notes (Signed)
Pt presents for hypertension follow-up, interested in oral weight loss, did not receive Tdap at 9/23 visit, wants today

## 2021-01-28 LAB — BASIC METABOLIC PANEL
BUN/Creatinine Ratio: 8 — ABNORMAL LOW (ref 9–23)
BUN: 8 mg/dL (ref 6–24)
CO2: 29 mmol/L (ref 20–29)
Calcium: 9.4 mg/dL (ref 8.7–10.2)
Chloride: 98 mmol/L (ref 96–106)
Creatinine, Ser: 1.01 mg/dL — ABNORMAL HIGH (ref 0.57–1.00)
Glucose: 89 mg/dL (ref 70–99)
Potassium: 3.7 mmol/L (ref 3.5–5.2)
Sodium: 139 mmol/L (ref 134–144)
eGFR: 71 mL/min/{1.73_m2} (ref >59)

## 2021-01-28 NOTE — Progress Notes (Signed)
Kidney function normal

## 2021-02-08 ENCOUNTER — Other Ambulatory Visit: Payer: Self-pay | Admitting: Family

## 2021-02-08 DIAGNOSIS — I10 Essential (primary) hypertension: Secondary | ICD-10-CM

## 2021-03-06 NOTE — Progress Notes (Signed)
Patient ID: Kristen Hines, female    DOB: 17-May-1980  MRN: [id number]  CC: Weight Management Follow-Up  Subjective: Kristen Hines is a 40 y.o. female who presents for weight management follow-up.  Her concerns today include:   WEIGHT MANAGEMENT FOLLOW-UP: 01/27/2021: - Begin Phentermine as prescribed.   03/07/2021: Doing well on current regimen. No side effects. No issues/concerns. Planning to incorporate exercise soon.  Patient Active Problem List   Diagnosis Date Noted   Essential hypertension 01/11/2021   Bacterial vaginitis 12/16/2020   Prediabetes 12/14/2020   History of total abdominal hysterectomy 10/08/2020   At high risk for breast cancer 09/14/2017   Genetic testing 07/16/2017   Family history of breast cancer    Family history of pancreatic cancer    Abnormal uterine bleeding 04/16/2015     Current Outpatient Medications on File Prior to Visit  Medication Sig Dispense Refill   hydrochlorothiazide (HYDRODIURIL) 12.5 MG tablet Take 1 tablet (12.5 mg total) by mouth daily. 90 tablet 0   lisinopril (ZESTRIL) 10 MG tablet Take 1 tablet (10 mg total) by mouth daily. 90 tablet 0   polyethylene glycol powder (GLYCOLAX/MIRALAX) 17 GM/SCOOP powder Take 255 g by mouth daily. 255 g 3   Semaglutide-Weight Management (WEGOVY) 0.25 MG/0.5ML SOAJ Inject 0.25 mg into the skin once a week. (Patient not taking: No sig reported) 2 mL 0   No current facility-administered medications on file prior to visit.    No Known Allergies  Social History   Socioeconomic History   Marital status: Single    Spouse name: Not on file   Number of children: Not on file   Years of education: Not on file   Highest education level: Not on file  Occupational History   Not on file  Tobacco Use   Smoking status: Never   Smokeless tobacco: Never  Vaping Use   Vaping Use: Never used  Substance and Sexual Activity   Alcohol use: Yes    Comment: SOCIAL   Drug use: No   Sexual  activity: Yes    Birth control/protection: Surgical  Other Topics Concern   Not on file  Social History Narrative   Not on file   Social Determinants of Health   Financial Resource Strain: Not on file  Food Insecurity: Not on file  Transportation Needs: Not on file  Physical Activity: Not on file  Stress: Not on file  Social Connections: Not on file  Intimate Partner Violence: Not on file    Family History  Problem Relation Age of Onset   Hypertension Mother    Breast cancer Mother 66       currently 53   Hypertension Father    Heart disease Father    Diabetes Father    Pancreatic cancer Maternal Aunt        diagnosed 74s; currently 52s   Prostate cancer Maternal Grandfather 53       currently 22s    Past Surgical History:  Procedure Laterality Date   ABDOMINAL HYSTERECTOMY Bilateral 04/16/2015   Procedure: HYSTERECTOMY ABDOMINAL WITH BILATERAL SALPINGECTOMY;  Surgeon: Molli Posey, MD;  Location: [place];  Service: Gynecology;  Laterality: Bilateral;   LAPAROSCOPIC VAGINAL HYSTERECTOMY WITH SALPINGECTOMY Bilateral 04/16/2015   Procedure:  ATTEMPTED LAPAROSCOPIC ASSISTED VAGINAL HYSTERECTOMY WITH BILATERAL SALPINGECTOMY;  Surgeon: Molli Posey, MD;  Location: [facility];  Service: Gynecology;  Laterality: Bilateral;   LAPAROSCOPY BILATERAL TUBAL WITH FILCHIE CLIPS/   D & C HYSTEROSCOPY ENDOMETRIAL  NOVASURE ABLATION  03-01-2009    ROS: Review of Systems Negative except as stated above  PHYSICAL EXAM: BP 126/85 (BP Location: Left Arm, Patient Position: Sitting, Cuff Size: Large)    Pulse 79    Temp 98.3 F (36.8 C)    Resp 18    Ht 5' 5.98" (1.676 m)    Wt 243 lb 8 oz (110.5 kg)    LMP 04/02/2015 (Exact Date)    SpO2 96%    BMI 39.32 kg/m   Wt Readings from Last 3 Encounters:  03/07/21 243 lb 8 oz (110.5 kg)  01/27/21 248 lb 6.4 oz (112.7 kg)  01/10/21 255 lb 12.8 oz (116 kg)    Physical Exam HENT:     Head: Normocephalic  and atraumatic.  Eyes:     Extraocular Movements: Extraocular movements intact.     Conjunctiva/sclera: Conjunctivae normal.     Pupils: Pupils are equal, round, and reactive to light.  Cardiovascular:     Rate and Rhythm: Normal rate and regular rhythm.     Pulses: Normal pulses.     Heart sounds: Normal heart sounds.  Pulmonary:     Effort: Pulmonary effort is normal.     Breath sounds: Normal breath sounds.  Musculoskeletal:     Cervical back: Normal range of motion and neck supple.  Neurological:     General: No focal deficit present.     Mental Status: She is alert and oriented to person, place, and time.  Psychiatric:        Mood and Affect: Mood normal.        Behavior: Behavior normal.   ASSESSMENT AND PLAN: 1. Encounter for weight management: - Patient lost 5 pounds since previous appointment.  - Increase Phentermine to 30 mg daily as prescribed. Counseled on medication compliance and adverse effects. - Follow-up with primary provider in 4 weeks or sooner if needed.  - phentermine 30 MG capsule; Take 1 capsule (30 mg total) by mouth every morning.  Dispense: 30 capsule; Refill: 0    Patient was given the opportunity to ask questions.  Patient verbalized understanding of the plan and was able to repeat key elements of the plan. Patient was given clear instructions to go to Emergency Department or return to medical center if symptoms don't improve, worsen, or new problems develop.The patient verbalized understanding.   Requested Prescriptions   Signed Prescriptions Disp Refills   phentermine 30 MG capsule 30 capsule 0    Sig: Take 1 capsule (30 mg total) by mouth every morning.    Return in about 4 weeks (around 04/04/2021) for Follow-Up or next available weight management.  [name], NP

## 2021-03-07 ENCOUNTER — Ambulatory Visit (INDEPENDENT_AMBULATORY_CARE_PROVIDER_SITE_OTHER): Payer: 59 | Admitting: Family

## 2021-03-07 ENCOUNTER — Other Ambulatory Visit: Payer: Self-pay

## 2021-03-07 VITALS — BP 126/85 | HR 79 | Temp 98.3°F | Resp 18 | Ht 65.98 in | Wt 243.5 lb

## 2021-03-07 DIAGNOSIS — Z7689 Persons encountering health services in other specified circumstances: Secondary | ICD-10-CM | POA: Diagnosis not present

## 2021-03-07 MED ORDER — PHENTERMINE HCL 30 MG PO CAPS: 30.0000 mg | ORAL_CAPSULE | ORAL | 0 refills | Status: DC

## 2021-03-07 NOTE — Progress Notes (Signed)
Pt presents for weight management

## 2021-03-25 NOTE — Progress Notes (Deleted)
Patient ID: Kristen Hines, female    DOB: 24-Sep-1980  MRN: [id number]  CC: Weight Management Follow-Up  Subjective: Kristen Hines is a 41 y.o. female who presents for hypertension follow-up.  Her concerns today include:  WEIGHT MANAGEMENT FOLLOW-UP: 03/07/2021: Patient lost 5 pounds since previous appointment.  - Increase Phentermine to 30 mg daily as prescribed.   03/31/2021:  Patient Active Problem List   Diagnosis Date Noted   Essential hypertension 01/11/2021   Bacterial vaginitis 12/16/2020   Prediabetes 12/14/2020   History of total abdominal hysterectomy 10/08/2020   At high risk for breast cancer 09/14/2017   Genetic testing 07/16/2017   Family history of breast cancer    Family history of pancreatic cancer    Abnormal uterine bleeding 04/16/2015     Current Outpatient Medications on File Prior to Visit  Medication Sig Dispense Refill   hydrochlorothiazide (HYDRODIURIL) 12.5 MG tablet Take 1 tablet (12.5 mg total) by mouth daily. 90 tablet 0   lisinopril (ZESTRIL) 10 MG tablet Take 1 tablet (10 mg total) by mouth daily. 90 tablet 0   phentermine 30 MG capsule Take 1 capsule (30 mg total) by mouth every morning. 30 capsule 0   polyethylene glycol powder (GLYCOLAX/MIRALAX) 17 GM/SCOOP powder Take 255 g by mouth daily. 255 g 3   Semaglutide-Weight Management (WEGOVY) 0.25 MG/0.5ML SOAJ Inject 0.25 mg into the skin once a week. (Patient not taking: No sig reported) 2 mL 0   No current facility-administered medications on file prior to visit.    No Known Allergies  Social History   Socioeconomic History   Marital status: Single    Spouse name: Not on file   Number of children: Not on file   Years of education: Not on file   Highest education level: Not on file  Occupational History   Not on file  Tobacco Use   Smoking status: Never   Smokeless tobacco: Never  Vaping Use   Vaping Use: Never used  Substance and Sexual Activity   Alcohol use: Yes     Comment: SOCIAL   Drug use: No   Sexual activity: Yes    Birth control/protection: Surgical  Other Topics Concern   Not on file  Social History Narrative   Not on file   Social Determinants of Health   Financial Resource Strain: Not on file  Food Insecurity: Not on file  Transportation Needs: Not on file  Physical Activity: Not on file  Stress: Not on file  Social Connections: Not on file  Intimate Partner Violence: Not on file    Family History  Problem Relation Age of Onset   Hypertension Mother    Breast cancer Mother 41       currently 84   Hypertension Father    Heart disease Father    Diabetes Father    Pancreatic cancer Maternal Aunt        diagnosed 31s; currently 27s   Prostate cancer Maternal Grandfather 61       currently 41s    Past Surgical History:  Procedure Laterality Date   ABDOMINAL HYSTERECTOMY Bilateral 04/16/2015   Procedure: HYSTERECTOMY ABDOMINAL WITH BILATERAL SALPINGECTOMY;  Surgeon: Molli Posey, MD;  Location: [place];  Service: Gynecology;  Laterality: Bilateral;   LAPAROSCOPIC VAGINAL HYSTERECTOMY WITH SALPINGECTOMY Bilateral 04/16/2015   Procedure:  ATTEMPTED LAPAROSCOPIC ASSISTED VAGINAL HYSTERECTOMY WITH BILATERAL SALPINGECTOMY;  Surgeon: Molli Posey, MD;  Location: [place];  Service: Gynecology;  Laterality: Bilateral;  LAPAROSCOPY BILATERAL TUBAL WITH FILCHIE CLIPS/   D & C HYSTEROSCOPY ENDOMETRIAL NOVASURE ABLATION  03-01-2009    ROS: Review of Systems Negative except as stated above  PHYSICAL EXAM: LMP 04/02/2015 (Exact Date)   Physical Exam  {female adult master:310786} {female adult master:310785}  CMP Latest Ref Rng & Units 01/27/2021 12/13/2020 06/01/2019  Glucose 70 - 99 mg/dL 89 77 92  BUN 6 - 24 mg/dL 8 10 8   Creatinine 0.57 - 1.00 mg/dL 1.01(H) 0.90 0.77  Sodium 134 - 144 mmol/L 139 136 139  Potassium 3.5 - 5.2 mmol/L 3.7 4.4 4.0  Chloride 96 - 106 mmol/L 98 99 104  CO2 20  - 29 mmol/L 29 24 27   Calcium 8.7 - 10.2 mg/dL 9.4 8.8 8.8(L)  Total Protein 6.0 - 8.5 g/dL - 6.8 7.1  Total Bilirubin 0.0 - 1.2 mg/dL - 0.3 0.6  Alkaline Phos 44 - 121 IU/L - 72 54  AST 0 - 40 IU/L - 10 24  ALT 0 - 32 IU/L - 6 23   Lipid Panel     Component Value Date/Time   CHOL 211 (H) 12/13/2020 1028   TRIG 104 12/13/2020 1028   HDL 55 12/13/2020 1028   CHOLHDL 3.8 12/13/2020 1028   LDLCALC 137 (H) 12/13/2020 1028    CBC    Component Value Date/Time   WBC 5.8 12/13/2020 1028   WBC 5.6 06/01/2019 1026   RBC 4.43 12/13/2020 1028   RBC 4.25 06/01/2019 1026   HGB 11.3 12/13/2020 1028   HCT 35.5 12/13/2020 1028   PLT 242 12/13/2020 1028   MCV 80 12/13/2020 1028   MCH 25.5 (L) 12/13/2020 1028   MCH 26.1 06/01/2019 1026   MCHC 31.8 12/13/2020 1028   MCHC 31.4 06/01/2019 1026   RDW 13.6 12/13/2020 1028    ASSESSMENT AND PLAN:  There are no diagnoses linked to this encounter.   Patient was given the opportunity to ask questions.  Patient verbalized understanding of the plan and was able to repeat key elements of the plan. Patient was given clear instructions to go to Emergency Department or return to medical center if symptoms don't improve, worsen, or new problems develop.The patient verbalized understanding.   No orders of the defined types were placed in this encounter.    Requested Prescriptions    No prescriptions requested or ordered in this encounter    No follow-ups on file.  [name], NP

## 2021-03-31 ENCOUNTER — Ambulatory Visit: Payer: 59 | Admitting: Family

## 2021-03-31 DIAGNOSIS — Z7689 Persons encountering health services in other specified circumstances: Secondary | ICD-10-CM

## 2021-04-06 NOTE — Progress Notes (Signed)
Patient ID: Kristen Hines, female    DOB: Jun 24, 1980  MRN: [id number]  CC: Hypertension Follow-Up   Subjective: Kristen Hines is a 41 y.o. female who presents for hypertension follow-up.  Her concerns today include:   HYPERTENSION FOLLOW-UP: 01/27/2021: - Continue Hydrochlorothiazide and Lisinopril as prescribed.   04/09/2021: Doing well on current regimen. No side effects. No issues/concerns. Denies chest pain and shortness of breath.   2. WEIGHT MANAGEMENT FOLLOW-UP: 03/07/2021: - Increase Phentermine to 30 mg daily as prescribed.   04/09/2021: Doing well on current regimen. Surgery planned with Gynecology soon, will take 2 week break of Phentermine at that time.   3. FOOT NUMBNESS: Reports intermittent bilateral foot numbness lasting several minutes. Does not smoke. 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 Not ready for medication just yet.   Patient Active Problem List   Diagnosis Date Noted   Essential hypertension 01/11/2021   Bacterial vaginitis 12/16/2020   Prediabetes 12/14/2020   History of total abdominal hysterectomy 10/08/2020   At high risk for breast cancer 09/14/2017   Genetic testing 07/16/2017   Family history of breast cancer    Family history of pancreatic cancer    Abnormal uterine bleeding 04/16/2015     Current Outpatient Medications on File Prior to Visit  Medication Sig Dispense Refill   polyethylene glycol powder (GLYCOLAX/MIRALAX) 17 GM/SCOOP powder Take 255 g by mouth daily. 255 g 3   Semaglutide-Weight Management (WEGOVY) 0.25 MG/0.5ML SOAJ Inject 0.25 mg into the skin once a week. (Patient not taking: No sig reported) 2 mL 0   No current facility-administered medications on file prior to visit.    No Known Allergies  Social History   Socioeconomic History   Marital status: Single    Spouse name: Not on file   Number of children: Not on file   Years of education: Not on file   Highest education level: Not on file  Occupational History    Not on file  Tobacco Use   Smoking status: Never   Smokeless tobacco: Never  Vaping Use   Vaping Use: Never used  Substance and Sexual Activity   Alcohol use: Yes    Comment: SOCIAL   Drug use: No   Sexual activity: Yes    Birth control/protection: Surgical  Other Topics Concern   Not on file  Social History Narrative   Not on file   Social Determinants of Health   Financial Resource Strain: Not on file  Food Insecurity: Not on file  Transportation Needs: Not on file  Physical Activity: Not on file  Stress: Not on file  Social Connections: Not on file  Intimate Partner Violence: Not on file    Family History  Problem Relation Age of Onset   Hypertension Mother    Breast cancer Mother 22       currently 21   Hypertension Father    Heart disease Father    Diabetes Father    Pancreatic cancer Maternal Aunt        diagnosed 74s; currently 17s   Prostate cancer Maternal Grandfather 38       currently 85s    Past Surgical History:  Procedure Laterality Date   ABDOMINAL HYSTERECTOMY Bilateral 04/16/2015   Procedure: HYSTERECTOMY ABDOMINAL WITH BILATERAL SALPINGECTOMY;  Surgeon: Molli Posey, MD;  Location: [facility];  Service: Gynecology;  Laterality: Bilateral;   LAPAROSCOPIC VAGINAL HYSTERECTOMY WITH SALPINGECTOMY Bilateral 04/16/2015   Procedure:  ATTEMPTED LAPAROSCOPIC ASSISTED VAGINAL HYSTERECTOMY WITH BILATERAL SALPINGECTOMY;  Surgeon:  Molli Posey, MD;  Location: [facility];  Service: Gynecology;  Laterality: Bilateral;   LAPAROSCOPY BILATERAL TUBAL WITH FILCHIE CLIPS/   D & C HYSTEROSCOPY ENDOMETRIAL NOVASURE ABLATION  03-01-2009    ROS: Review of Systems Negative except as stated above  PHYSICAL EXAM: BP 119/80 (BP Location: Left Arm, Patient Position: Sitting, Cuff Size: Large)    Pulse 76    Temp 98.3 F (36.8 C)    Resp 18    Ht 5' 5.98" (1.676 m)    Wt 238 lb (108 kg)    LMP 04/02/2015 (Exact Date)    SpO2 95%    BMI  38.43 kg/m   Wt Readings from Last 3 Encounters:  04/09/21 238 lb (108 kg)  03/07/21 243 lb 8 oz (110.5 kg)  01/27/21 248 lb 6.4 oz (112.7 kg)    Physical Exam HENT:     Head: Normocephalic and atraumatic.  Eyes:     Extraocular Movements: Extraocular movements intact.     Conjunctiva/sclera: Conjunctivae normal.     Pupils: Pupils are equal, round, and reactive to light.  Cardiovascular:     Rate and Rhythm: Normal rate and regular rhythm.     Pulses: Normal pulses.     Heart sounds: Normal heart sounds.  Pulmonary:     Effort: Pulmonary effort is normal.     Breath sounds: Normal breath sounds.  Musculoskeletal:     Cervical back: Normal range of motion.  Neurological:     General: No focal deficit present.     Mental Status: She is alert and oriented to person, place, and time.  Psychiatric:        Mood and Affect: Mood normal.        Behavior: Behavior normal.    ASSESSMENT AND PLAN: 1. Essential hypertension: - Continue Lisinopril and Hydrochlorothiazide as prescribed.  - Counseled on blood pressure goal of less than 130/80, low-sodium, DASH diet, medication compliance, 150 minutes of moderate intensity exercise per week as tolerated. Discussed medication compliance, adverse effects. - Follow-up with primary provider in 3 months or sooner if needed.  - lisinopril (ZESTRIL) 10 MG tablet; Take 1 tablet (10 mg total) by mouth daily.  Dispense: 90 tablet; Refill: 0 - hydrochlorothiazide (HYDRODIURIL) 12.5 MG tablet; Take 1 tablet (12.5 mg total) by mouth daily.  Dispense: 90 tablet; Refill: 0  2. Encounter for weight management: - Continue Phentermine as prescribed. - Counseled patient 3 month medication holiday to begin once current prescription complete.  - Follow-up with primary provider as scheduled.  - phentermine 37.5 MG capsule; Take 1 capsule (37.5 mg total) by mouth every morning.  Dispense: 30 capsule; Refill: 0  3. Numbness of foot: - Patient prefers to  watchful wait as of present. Declined pharmacological management.  - Follow-up with primary provider as scheduled.     Patient was given the opportunity to ask questions.  Patient verbalized understanding of the plan and was able to repeat key elements of the plan. Patient was given clear instructions to go to Emergency Department or return to medical center if symptoms don't improve, worsen, or new problems develop.The patient verbalized understanding.    Requested Prescriptions   Signed Prescriptions Disp Refills   lisinopril (ZESTRIL) 10 MG tablet 90 tablet 0    Sig: Take 1 tablet (10 mg total) by mouth daily.   hydrochlorothiazide (HYDRODIURIL) 12.5 MG tablet 90 tablet 0    Sig: Take 1 tablet (12.5 mg total) by mouth daily.   phentermine  37.5 MG capsule 30 capsule 0    Sig: Take 1 capsule (37.5 mg total) by mouth every morning.    Return in about 3 months (around 07/08/2021) for Follow-Up or next available hypertension.  [name], NP

## 2021-04-09 ENCOUNTER — Other Ambulatory Visit: Payer: Self-pay

## 2021-04-09 ENCOUNTER — Ambulatory Visit (INDEPENDENT_AMBULATORY_CARE_PROVIDER_SITE_OTHER): Payer: 59 | Admitting: Family

## 2021-04-09 VITALS — BP 119/80 | HR 76 | Temp 98.3°F | Resp 18 | Ht 65.98 in | Wt 238.0 lb

## 2021-04-09 DIAGNOSIS — I10 Essential (primary) hypertension: Secondary | ICD-10-CM | POA: Diagnosis not present

## 2021-04-09 DIAGNOSIS — Z7689 Persons encountering health services in other specified circumstances: Secondary | ICD-10-CM

## 2021-04-09 DIAGNOSIS — R200 Anesthesia of skin: Secondary | ICD-10-CM

## 2021-04-09 MED ORDER — HYDROCHLOROTHIAZIDE 12.5 MG PO TABS: 12.5000 mg | ORAL_TABLET | Freq: Every day | ORAL | 0 refills | Status: DC

## 2021-04-09 MED ORDER — PHENTERMINE HCL 37.5 MG PO CAPS: 37.5000 mg | ORAL_CAPSULE | ORAL | 0 refills | Status: DC

## 2021-04-09 MED ORDER — LISINOPRIL 10 MG PO TABS: 10.0000 mg | ORAL_TABLET | Freq: Every day | ORAL | 0 refills | Status: DC

## 2021-04-09 NOTE — Progress Notes (Signed)
Pt presents for hypertension and weight management follow-up, pt complains of sensitivity to cold in her feet going on for approx 2 weeks

## 2021-04-09 NOTE — Patient Instructions (Signed)
Hydrochlorothiazide Capsules or Tablets What is this medication? HYDROCHLOROTHIAZIDE (hye droe klor oh THYE a zide) treats high blood pressure. It may also be used to reduce swelling related to heart, kidney, or liver disease. It helps your kidneys remove more fluid and salt from your blood through the urine. It belongs to a group of medications called diuretics. This medicine may be used for other purposes; ask your health care provider or pharmacist if you have questions. COMMON BRAND NAME(S): Esidrix, Ezide, HydroDIURIL, Microzide, Oretic, Zide What should I tell my care team before I take this medication? They need to know if you have any of these conditions: Diabetes Gout Kidney disease Liver disease Lupus Pancreatitis An unusual or allergic reaction to hydrochlorothiazide, sulfa drugs, other medications, foods, dyes, or preservatives Pregnant or trying to get pregnant Breast-feeding How should I use this medication? Take this medication by mouth. Take it as directed on the prescription label at the same time every day. You can take it with or without food. If it upsets your stomach, take it with food. Keep taking it unless your care team tells you to stop. Talk to your care team about the use of this medication in children. While it may be prescribed for children as young as newborns for selected conditions, precautions do apply. Overdosage: If you think you have taken too much of this medicine contact a poison control center or emergency room at once. NOTE: This medicine is only for you. Do not share this medicine with others. What if I miss a dose? If you miss a dose, take it as soon as you can. If it is almost time for your next dose, take only that dose. Do not take double or extra doses. What may interact with this medication? Cholestyramine Colestipol Digoxin Dofetilide Lithium Medications for blood pressure Medications for diabetes Medications that relax muscles for  surgery Other diuretics Steroid medications like prednisone or cortisone This list may not describe all possible interactions. Give your health care provider a list of all the medicines, herbs, non-prescription drugs, or dietary supplements you use. Also tell them if you smoke, drink alcohol, or use illegal drugs. Some items may interact with your medicine. What should I watch for while using this medication? Visit your health care provider for regular check-ups. Check your blood pressure as directed. Ask your health care provider what your blood pressure should be. Also, find out when you should contact him or her. Do not treat yourself for coughs, colds, or pain while you are using this medication without asking your health care provider for advice. Some medications may increase your blood pressure. You may get drowsy or dizzy. Do not drive, use machinery, or do anything that needs mental alertness until you know how this medication affects you. Do not stand or sit up quickly, especially if you are an older patient. This reduces the risk of dizzy or fainting spells. Alcohol can make you more drowsy and dizzy. Avoid alcoholic drinks. Talk to your health care professional about your risk of skin cancer. You may be more at risk for skin cancer if you take this medication. This medication can make you more sensitive to the sun. Keep out of the sun. If you cannot avoid being in the sun, wear protective clothing and use sunscreen. Do not use sun lamps or tanning beds/booths. You may need to be on a special diet while taking this medication. Ask your health care provider. Also, find out how many glasses of fluids you  need to drink each day. Check with your health care provider if you get an attack of severe diarrhea, nausea and vomiting, or if you sweat a lot. The loss of too much body fluid can make it dangerous for you to take this medication. This medication may increase blood sugar. Ask your healthcare  provider if changes in diet or medications are needed if you have diabetes. What side effects may I notice from receiving this medication? Side effects that you should report to your care team as soon as possible: Allergic reactions--skin rash, itching, hives, swelling of the face, lips, tongue, or throat Dehydration--increased thirst, dry mouth, feeling faint or lightheaded, headache, dark yellow or brown urine Gout--severe pain, redness, warmth, or swelling in joints, such as the big toe Kidney injury--decrease in the amount of urine, swelling of the ankles, hands, or feet Low blood pressure--dizziness, feeling faint or lightheaded, blurry vision Low potassium level--muscle pain or cramps, unusual weakness, fatigue, fast or irregular heartbeat, constipation Sudden eye pain or change in vision such as blurred vision, seeing halos around lights, vision loss Side effects that usually do not require medical attention (report to your care team if they continue or are bothersome): Change in sex drive or performance Headache Upset stomach This list may not describe all possible side effects. Call your doctor for medical advice about side effects. You may report side effects to FDA at 1-800-FDA-1088. Where should I keep my medication? Keep out of the reach of children and pets. Store at room temperature between 20 and 25 degrees C (68 and 77 degrees F). Protect from light and moisture. Keep the container tightly closed. Do not freeze. Get rid of any unused medication after the expiration date. To get rid of medications that are no longer needed or have expired: Take the medication to a medication take-back program. Check with your pharmacy or law enforcement to find a location. If you cannot return the medication, check the label or package insert to see if the medication should be thrown out in the garbage or flushed down the toilet. If you are not sure, ask your care team. If it is safe to put in the  trash, empty the medication out of the container. Mix the medication with cat litter, dirt, coffee grounds, or other unwanted substance. Seal the mixture in a bag or container. Put it in the trash. NOTE: This sheet is a summary. It may not cover all possible information. If you have questions about this medicine, talk to your doctor, pharmacist, or health care provider.  2022 Elsevier/Gold Standard (2020-11-26 00:00:00)

## 2021-04-29 NOTE — H&P (Signed)
NAME: Kristen Hines, Kristen Hines MEDICAL RECORD NO: [id number] ACCOUNT NO: [id number] DATE OF BIRTH: 02-26-1981 PHYSICIAN: Ralene Bathe. Matthew Saras, MD  History and Physical   DATE OF ADMISSION: 05/12/2021  Scheduled for surgery at [facility] is 02/20.  CHIEF COMPLAINT:  Pelvic pain/ovarian cyst.  HISTORY OF PRESENT ILLNESS:  A 41 year old G2 P2 who has had a prior TAH.  I have been following her for pelvic pain, ultrasound in our office have shown a 5 x 5.5 cm simple-appearing cyst on the right with no free fluid noted.  CA-125 was normal at 11.   Due to continued problems with pain she presents at this time for laparoscopic evaluation including possible RSO/BSO.  This procedure including risks related to bleeding, infection, adjacent organ injury, wound infection, phlebitis, possible need to  complete the surgery by open technique including RSO or BSO discussed.  That could also entail consideration of ERT for example.  PAST MEDICAL HISTORY:  ALLERGIES:  None.  CURRENT MEDICATIONS:  HCTZ 12.5, Motrin 800 p.r.n., lisinopril 10 mg daily, tramadol 50 mg q. 6 hours p.r.n., phentermine p.r.n. appetite suppression.  Last mammogram 2/22, she has also had a prior breast MRI and [facility] genetic screening that was negative.  Colonoscopy 2017.  FAMILY HISTORY:  Significant for mother with hypertension.  Her father had an MI.  SOCIAL HISTORY:  Not currently sexually active.  She is single.  Occasional alcohol use .  Denies any other tobacco use.  PAST SURGICAL HISTORY:  She has had a prior tubal, TAH in 2017 with bilateral salpingectomy.  Ultrasound here 03/12/2021, right ovarian probable hemorrhagic cyst, left side, not well visualized.  PHYSICAL EXAMINATION:   VITAL SIGNS:  Temperature 98.2, blood pressure 140/92, weight 256.  BMI 40.4. HEENT:  Unremarkable. NECK:  Supple, without masses. LUNGS:  Clear. CARDIOVASCULAR:  Regular rate and rhythm without murmurs, rubs or gallops. BREASTS:  Without  masses, tenderness, or nipple discharge. ABDOMEN:  Soft, flat, nontender. PELVIC:  Vulva, vagina, vaginal cuff normal.  Bimanual slightly tender right.  Difficult to appreciate any definite mass. EXTREMITIES:  Unremarkable. NEUROLOGIC:  Unremarkable.  ASSESSMENT AND PLAN:  Pelvic pain, right ovarian cyst, normal CA-125.  Laparoscopy with possible USO, BSO, reviewed the possibility of laparotomy with USO or BSO.  Procedure and risks discussed as above.   SHY D: 04/28/2021 11:59:11 am T: 04/28/2021 10:45:00 pm  JOB: [id number]/ [id number]

## 2021-05-05 ENCOUNTER — Encounter (HOSPITAL_BASED_OUTPATIENT_CLINIC_OR_DEPARTMENT_OTHER): Payer: Self-pay | Admitting: Obstetrics and Gynecology

## 2021-05-05 ENCOUNTER — Other Ambulatory Visit: Payer: Self-pay

## 2021-05-05 NOTE — Progress Notes (Signed)
Spoke w/ via phone for pre-op interview---[facility] needs dos----     EKG and ISTAT          Lab results------ COVID test -----patient states asymptomatic no test needed Arrive at -------0530 NPO after MN NO Solid Food.  Med rec completed Medications to take morning of surgery -----NONE Diabetic medication ----- Patient instructed no nail polish to be worn day of surgery Patient instructed to bring photo id and insurance card day of surgery Patient aware to have Driver (ride ) / caregiver Daughter Joss Friedel   for 24 hours after surgery  Patient Special Instructions ----- Pre-Op special Istructions ----- Patient verbalized understanding of instructions that were given at this phone interview. Patient denies shortness of breath, chest pain, fever, cough at this phone interview.

## 2021-05-08 ENCOUNTER — Other Ambulatory Visit: Payer: Self-pay | Admitting: Obstetrics and Gynecology

## 2021-05-08 DIAGNOSIS — Z803 Family history of malignant neoplasm of breast: Secondary | ICD-10-CM

## 2021-05-08 DIAGNOSIS — R898 Other abnormal findings in specimens from other organs, systems and tissues: Secondary | ICD-10-CM

## 2021-05-12 ENCOUNTER — Other Ambulatory Visit: Payer: Self-pay

## 2021-05-12 ENCOUNTER — Ambulatory Visit (HOSPITAL_BASED_OUTPATIENT_CLINIC_OR_DEPARTMENT_OTHER): Payer: 59 | Admitting: Anesthesiology

## 2021-05-12 ENCOUNTER — Encounter (HOSPITAL_BASED_OUTPATIENT_CLINIC_OR_DEPARTMENT_OTHER)
Admission: RE | Disposition: A | Discharge status: Home / Self Care | Payer: Self-pay | Source: Home / Self Care | Attending: Obstetrics and Gynecology

## 2021-05-12 ENCOUNTER — Encounter (HOSPITAL_BASED_OUTPATIENT_CLINIC_OR_DEPARTMENT_OTHER): Payer: Self-pay | Admitting: Obstetrics and Gynecology

## 2021-05-12 ENCOUNTER — Observation Stay (HOSPITAL_BASED_OUTPATIENT_CLINIC_OR_DEPARTMENT_OTHER)
Admission: RE | Admit: 2021-05-12 | Discharge: 2021-05-13 | Disposition: A | Discharge status: Home / Self Care | Payer: 59 | Attending: Obstetrics and Gynecology | Admitting: Obstetrics and Gynecology

## 2021-05-12 DIAGNOSIS — N83291 Other ovarian cyst, right side: Secondary | ICD-10-CM | POA: Diagnosis not present

## 2021-05-12 DIAGNOSIS — R102 Pelvic and perineal pain unspecified side: Secondary | ICD-10-CM | POA: Diagnosis present

## 2021-05-12 DIAGNOSIS — N736 Female pelvic peritoneal adhesions (postinfective): Secondary | ICD-10-CM

## 2021-05-12 DIAGNOSIS — N80122 Deep endometriosis of left ovary: Secondary | ICD-10-CM | POA: Diagnosis not present

## 2021-05-12 DIAGNOSIS — N8301 Follicular cyst of right ovary: Secondary | ICD-10-CM | POA: Insufficient documentation

## 2021-05-12 DIAGNOSIS — Z90710 Acquired absence of both cervix and uterus: Secondary | ICD-10-CM | POA: Insufficient documentation

## 2021-05-12 DIAGNOSIS — N83201 Unspecified ovarian cyst, right side: Secondary | ICD-10-CM | POA: Diagnosis not present

## 2021-05-12 DIAGNOSIS — I10 Essential (primary) hypertension: Secondary | ICD-10-CM | POA: Diagnosis not present

## 2021-05-12 HISTORY — PX: LAPAROSCOPY: SHX197

## 2021-05-12 HISTORY — DX: Essential (primary) hypertension: I10

## 2021-05-12 HISTORY — PX: LAPAROTOMY: SHX154

## 2021-05-12 LAB — POCT I-STAT, CHEM 8
BUN: 14 mg/dL (ref 6–20)
Calcium, Ion: 1.13 mmol/L — ABNORMAL LOW (ref 1.15–1.40)
Chloride: 103 mmol/L (ref 98–111)
Creatinine, Ser: 0.9 mg/dL (ref 0.44–1.00)
Glucose, Bld: 84 mg/dL (ref 70–99)
HCT: 36 % (ref 36.0–46.0)
Hemoglobin: 12.2 g/dL (ref 12.0–15.0)
Potassium: 3.8 mmol/L (ref 3.5–5.1)
Sodium: 141 mmol/L (ref 135–145)
TCO2: 27 mmol/L (ref 22–32)

## 2021-05-12 LAB — TYPE AND SCREEN
ABO/RH(D): O POS
Antibody Screen: NEGATIVE

## 2021-05-12 SURGERY — LAPAROSCOPY, DIAGNOSTIC
Anesthesia: General

## 2021-05-12 MED ORDER — GABAPENTIN 100 MG PO CAPS
ORAL_CAPSULE | ORAL | Status: AC
  Filled 2021-05-12: qty 2

## 2021-05-12 MED ORDER — HYDROMORPHONE HCL 1 MG/ML IJ SOLN
INTRAMUSCULAR | Status: DC | PRN
  Administered 2021-05-12: 1 mg via INTRAVENOUS

## 2021-05-12 MED ORDER — OXYCODONE HCL 5 MG/5ML PO SOLN: 5.0000 mg | Freq: Once | ORAL | Status: DC | PRN

## 2021-05-12 MED ORDER — KETOROLAC TROMETHAMINE 30 MG/ML IJ SOLN
INTRAMUSCULAR | Status: AC
  Filled 2021-05-12: qty 1

## 2021-05-12 MED ORDER — PROPOFOL 10 MG/ML IV BOLUS
INTRAVENOUS | Status: AC
  Filled 2021-05-12: qty 20

## 2021-05-12 MED ORDER — HYDROCHLOROTHIAZIDE 12.5 MG PO TABS
12.5000 mg | ORAL_TABLET | Freq: Every day | ORAL | Status: DC
  Administered 2021-05-12: 12.5 mg via ORAL
  Filled 2021-05-12: qty 1

## 2021-05-12 MED ORDER — CEFAZOLIN SODIUM-DEXTROSE 2-4 GM/100ML-% IV SOLN
INTRAVENOUS | Status: AC
  Filled 2021-05-12: qty 100

## 2021-05-12 MED ORDER — DEXAMETHASONE SODIUM PHOSPHATE 10 MG/ML IJ SOLN
INTRAMUSCULAR | Status: AC
  Filled 2021-05-12: qty 1

## 2021-05-12 MED ORDER — LIDOCAINE HCL (PF) 2 % IJ SOLN
INTRAMUSCULAR | Status: AC
  Filled 2021-05-12: qty 5

## 2021-05-12 MED ORDER — SCOPOLAMINE 1 MG/3DAYS TD PT72
1.0000 | MEDICATED_PATCH | TRANSDERMAL | Status: DC
  Administered 2021-05-12: 1.5 mg via TRANSDERMAL

## 2021-05-12 MED ORDER — PROMETHAZINE HCL 25 MG/ML IJ SOLN: 25.0000 mg | Freq: Four times a day (QID) | INTRAMUSCULAR | Status: DC | PRN

## 2021-05-12 MED ORDER — WHITE PETROLATUM EX OINT
TOPICAL_OINTMENT | CUTANEOUS | Status: AC
  Filled 2021-05-12: qty 15

## 2021-05-12 MED ORDER — MIDAZOLAM HCL 5 MG/5ML IJ SOLN
INTRAMUSCULAR | Status: DC | PRN
Start: 2021-05-12 — End: 2021-05-12
  Administered 2021-05-12: 2 mg via INTRAVENOUS

## 2021-05-12 MED ORDER — POLYETHYLENE GLYCOL 3350 17 G PO PACK: 17.0000 g | PACK | Freq: Every day | ORAL | Status: DC | PRN

## 2021-05-12 MED ORDER — DEXAMETHASONE SODIUM PHOSPHATE 10 MG/ML IJ SOLN
INTRAMUSCULAR | Status: DC | PRN
  Administered 2021-05-12: 5 mg via INTRAVENOUS

## 2021-05-12 MED ORDER — SODIUM CHLORIDE 0.9 % IV SOLN
25.0000 mg | Freq: Four times a day (QID) | INTRAVENOUS | Status: DC | PRN
  Administered 2021-05-12: 25 mg via INTRAVENOUS
  Filled 2021-05-12: qty 25

## 2021-05-12 MED ORDER — ONDANSETRON HCL 4 MG/2ML IJ SOLN
INTRAMUSCULAR | Status: AC
  Filled 2021-05-12: qty 2

## 2021-05-12 MED ORDER — LISINOPRIL 10 MG PO TABS
10.0000 mg | ORAL_TABLET | Freq: Every day | ORAL | Status: DC
  Administered 2021-05-12: 10 mg via ORAL
  Filled 2021-05-12: qty 1

## 2021-05-12 MED ORDER — FENTANYL CITRATE (PF) 100 MCG/2ML IJ SOLN
INTRAMUSCULAR | Status: DC | PRN
  Administered 2021-05-12: 100 ug via INTRAVENOUS
  Administered 2021-05-12 (×2): 50 ug via INTRAVENOUS

## 2021-05-12 MED ORDER — GLYCOPYRROLATE PF 0.2 MG/ML IJ SOSY
PREFILLED_SYRINGE | INTRAMUSCULAR | Status: DC | PRN
  Administered 2021-05-12: .2 mg via INTRAVENOUS

## 2021-05-12 MED ORDER — SODIUM CHLORIDE (PF) 0.9 % IJ SOLN
INTRAMUSCULAR | Status: AC
  Filled 2021-05-12: qty 20

## 2021-05-12 MED ORDER — PANTOPRAZOLE SODIUM 40 MG PO TBEC
DELAYED_RELEASE_TABLET | ORAL | Status: AC
  Filled 2021-05-12: qty 1

## 2021-05-12 MED ORDER — POVIDONE-IODINE 10 % EX SWAB: 2.0000 "application " | Freq: Once | CUTANEOUS | Status: DC

## 2021-05-12 MED ORDER — AMISULPRIDE (ANTIEMETIC) 5 MG/2ML IV SOLN
INTRAVENOUS | Status: AC
  Filled 2021-05-12: qty 2

## 2021-05-12 MED ORDER — ACETAMINOPHEN 10 MG/ML IV SOLN: 1000.0000 mg | Freq: Once | INTRAVENOUS | Status: DC | PRN

## 2021-05-12 MED ORDER — GLYCOPYRROLATE PF 0.2 MG/ML IJ SOSY
PREFILLED_SYRINGE | INTRAMUSCULAR | Status: AC
  Filled 2021-05-12: qty 1

## 2021-05-12 MED ORDER — SCOPOLAMINE 1 MG/3DAYS TD PT72
MEDICATED_PATCH | TRANSDERMAL | Status: AC
  Filled 2021-05-12: qty 1

## 2021-05-12 MED ORDER — AMISULPRIDE (ANTIEMETIC) 5 MG/2ML IV SOLN
10.0000 mg | Freq: Once | INTRAVENOUS | Status: AC | PRN
  Administered 2021-05-12: 10 mg via INTRAVENOUS

## 2021-05-12 MED ORDER — MIDAZOLAM HCL 2 MG/2ML IJ SOLN
INTRAMUSCULAR | Status: AC
  Filled 2021-05-12: qty 2

## 2021-05-12 MED ORDER — MORPHINE SULFATE (PF) 4 MG/ML IV SOLN
INTRAVENOUS | Status: AC
  Filled 2021-05-12: qty 1

## 2021-05-12 MED ORDER — MORPHINE SULFATE (PF) 4 MG/ML IV SOLN
1.0000 mg | INTRAVENOUS | Status: DC | PRN
  Administered 2021-05-12: 1 mg via INTRAVENOUS
  Administered 2021-05-12: 2 mg via INTRAVENOUS

## 2021-05-12 MED ORDER — KETOROLAC TROMETHAMINE 30 MG/ML IJ SOLN
30.0000 mg | Freq: Four times a day (QID) | INTRAMUSCULAR | Status: DC
  Administered 2021-05-12 – 2021-05-13 (×3): 30 mg via INTRAVENOUS

## 2021-05-12 MED ORDER — HYDROCODONE-ACETAMINOPHEN 5-325 MG PO TABS
1.0000 | ORAL_TABLET | ORAL | Status: DC | PRN
  Administered 2021-05-12 (×2): 2 via ORAL
  Administered 2021-05-13: 1 via ORAL

## 2021-05-12 MED ORDER — ACETAMINOPHEN 160 MG/5ML PO SOLN: 325.0000 mg | ORAL | Status: DC | PRN

## 2021-05-12 MED ORDER — SODIUM CHLORIDE 0.9 % IR SOLN
Status: DC | PRN
  Administered 2021-05-12: 1500 mL

## 2021-05-12 MED ORDER — GABAPENTIN 100 MG PO CAPS
200.0000 mg | ORAL_CAPSULE | Freq: Three times a day (TID) | ORAL | Status: DC
  Administered 2021-05-12 (×2): 200 mg via ORAL

## 2021-05-12 MED ORDER — PROPOFOL 10 MG/ML IV BOLUS
INTRAVENOUS | Status: DC | PRN
  Administered 2021-05-12: 50 mg via INTRAVENOUS
  Administered 2021-05-12: 150 mg via INTRAVENOUS

## 2021-05-12 MED ORDER — BUPIVACAINE HCL (PF) 0.25 % IJ SOLN
INTRAMUSCULAR | Status: DC | PRN
Start: 2021-05-12 — End: 2021-05-12
  Administered 2021-05-12: 5 mL

## 2021-05-12 MED ORDER — HYDROMORPHONE HCL 2 MG/ML IJ SOLN
INTRAMUSCULAR | Status: AC
  Filled 2021-05-12: qty 1

## 2021-05-12 MED ORDER — MENTHOL 3 MG MT LOZG: 1.0000 | LOZENGE | OROMUCOSAL | Status: DC | PRN

## 2021-05-12 MED ORDER — IBUPROFEN 200 MG PO TABS: 600.0000 mg | ORAL_TABLET | Freq: Four times a day (QID) | ORAL | Status: DC

## 2021-05-12 MED ORDER — BUPIVACAINE LIPOSOME 1.3 % IJ SUSP
INTRAMUSCULAR | Status: DC | PRN
  Administered 2021-05-12: 20 mL

## 2021-05-12 MED ORDER — CEFAZOLIN SODIUM-DEXTROSE 2-4 GM/100ML-% IV SOLN
2.0000 g | INTRAVENOUS | Status: AC
  Administered 2021-05-12: 2 g via INTRAVENOUS

## 2021-05-12 MED ORDER — LACTATED RINGERS IV SOLN: INTRAVENOUS | Status: DC

## 2021-05-12 MED ORDER — SUGAMMADEX SODIUM 200 MG/2ML IV SOLN
INTRAVENOUS | Status: DC | PRN
Start: 2021-05-12 — End: 2021-05-12
  Administered 2021-05-12: 200 mg via INTRAVENOUS

## 2021-05-12 MED ORDER — DEXTROSE IN LACTATED RINGERS 5 % IV SOLN: INTRAVENOUS | Status: DC

## 2021-05-12 MED ORDER — PROMETHAZINE HCL 25 MG/ML IJ SOLN: 6.2500 mg | INTRAMUSCULAR | Status: DC | PRN

## 2021-05-12 MED ORDER — EPHEDRINE 5 MG/ML INJ
INTRAVENOUS | Status: AC
  Filled 2021-05-12: qty 5

## 2021-05-12 MED ORDER — KETOROLAC TROMETHAMINE 30 MG/ML IJ SOLN
INTRAMUSCULAR | Status: DC | PRN
Start: 2021-05-12 — End: 2021-05-12
  Administered 2021-05-12: 30 mg via INTRAVENOUS

## 2021-05-12 MED ORDER — LIDOCAINE 2% (20 MG/ML) 5 ML SYRINGE
INTRAMUSCULAR | Status: DC | PRN
  Administered 2021-05-12: 50 mg via INTRAVENOUS

## 2021-05-12 MED ORDER — ACETAMINOPHEN 325 MG PO TABS: 325.0000 mg | ORAL_TABLET | ORAL | Status: DC | PRN

## 2021-05-12 MED ORDER — OXYCODONE HCL 5 MG PO TABS: 5.0000 mg | ORAL_TABLET | Freq: Once | ORAL | Status: DC | PRN

## 2021-05-12 MED ORDER — FENTANYL CITRATE (PF) 100 MCG/2ML IJ SOLN
INTRAMUSCULAR | Status: AC
  Filled 2021-05-12: qty 2

## 2021-05-12 MED ORDER — ONDANSETRON HCL 4 MG/2ML IJ SOLN
INTRAMUSCULAR | Status: DC | PRN
  Administered 2021-05-12: 4 mg via INTRAVENOUS

## 2021-05-12 MED ORDER — FENTANYL CITRATE (PF) 100 MCG/2ML IJ SOLN
25.0000 ug | INTRAMUSCULAR | Status: DC | PRN
  Administered 2021-05-12 (×2): 25 ug via INTRAVENOUS

## 2021-05-12 MED ORDER — PANTOPRAZOLE SODIUM 40 MG PO TBEC
40.0000 mg | DELAYED_RELEASE_TABLET | Freq: Every day | ORAL | Status: DC
  Administered 2021-05-12: 40 mg via ORAL

## 2021-05-12 MED ORDER — HYDROCODONE-ACETAMINOPHEN 5-325 MG PO TABS
ORAL_TABLET | ORAL | Status: AC
  Filled 2021-05-12: qty 2

## 2021-05-12 MED ORDER — ONDANSETRON HCL 4 MG/2ML IJ SOLN
4.0000 mg | Freq: Four times a day (QID) | INTRAMUSCULAR | Status: DC | PRN
  Administered 2021-05-12: 4 mg via INTRAVENOUS

## 2021-05-12 MED ORDER — EPHEDRINE SULFATE (PRESSORS) 50 MG/ML IJ SOLN
INTRAMUSCULAR | Status: DC | PRN
  Administered 2021-05-12: 10 mg via INTRAVENOUS
  Administered 2021-05-12: 5 mg via INTRAVENOUS
  Administered 2021-05-12: 10 mg via INTRAVENOUS

## 2021-05-12 MED ORDER — HEMOSTATIC AGENTS (NO CHARGE) OPTIME
TOPICAL | Status: DC | PRN
  Administered 2021-05-12: 1

## 2021-05-12 MED ORDER — ROCURONIUM BROMIDE 10 MG/ML (PF) SYRINGE
PREFILLED_SYRINGE | INTRAVENOUS | Status: DC | PRN
  Administered 2021-05-12: 20 mg via INTRAVENOUS
  Administered 2021-05-12: 60 mg via INTRAVENOUS
  Administered 2021-05-12: 20 mg via INTRAVENOUS

## 2021-05-12 MED ORDER — ONDANSETRON HCL 4 MG PO TABS: 4.0000 mg | ORAL_TABLET | Freq: Four times a day (QID) | ORAL | Status: DC | PRN

## 2021-05-12 MED ORDER — ROCURONIUM BROMIDE 10 MG/ML (PF) SYRINGE
PREFILLED_SYRINGE | INTRAVENOUS | Status: AC
  Filled 2021-05-12: qty 10

## 2021-05-12 MED ORDER — FENTANYL CITRATE (PF) 250 MCG/5ML IJ SOLN
INTRAMUSCULAR | Status: AC
  Filled 2021-05-12: qty 5

## 2021-05-12 MED ORDER — ONDANSETRON HCL 4 MG/2ML IJ SOLN
INTRAMUSCULAR | Status: AC
Start: 2021-05-12 — End: ?
  Filled 2021-05-12: qty 2

## 2021-05-12 SURGICAL SUPPLY — 86 items
ADH SKN CLS APL DERMABOND .7 (GAUZE/BANDAGES/DRESSINGS) ×2
APL SKNCLS STERI-STRIP NONHPOA (GAUZE/BANDAGES/DRESSINGS) ×2
BAG DRN RND TRDRP ANRFLXCHMBR (UROLOGICAL SUPPLIES) ×2
BAG RETRIEVAL 10 (BASKET)
BAG URINE DRAIN 2000ML AR STRL (UROLOGICAL SUPPLIES) ×3 IMPLANT
BARRIER ADHS 3X4 INTERCEED (GAUZE/BANDAGES/DRESSINGS) IMPLANT
BENZOIN TINCTURE PRP APPL 2/3 (GAUZE/BANDAGES/DRESSINGS) ×1 IMPLANT
BLADE CLIPPER SENSICLIP SURGIC (BLADE) ×1 IMPLANT
BLADE EXTENDED COATED 6.5IN (ELECTRODE) IMPLANT
BLADE SURG 10 STRL SS (BLADE) ×5 IMPLANT
BRR ADH 4X3 ABS CNTRL BYND (GAUZE/BANDAGES/DRESSINGS)
CATH FOLEY 2WAY SLVR  5CC 14FR (CATHETERS) ×3
CATH FOLEY 2WAY SLVR 5CC 14FR (CATHETERS) ×2 IMPLANT
CATH ROBINSON RED A/P 16FR (CATHETERS) ×2 IMPLANT
CELLS DAT CNTRL 66122 CELL SVR (MISCELLANEOUS) ×2 IMPLANT
COVER MAYO STAND STRL (DRAPES) ×3 IMPLANT
DECANTER SPIKE VIAL GLASS SM (MISCELLANEOUS) IMPLANT
DERMABOND ADVANCED (GAUZE/BANDAGES/DRESSINGS) ×1
DERMABOND ADVANCED .7 DNX12 (GAUZE/BANDAGES/DRESSINGS) IMPLANT
DRAPE LAPAROTOMY T 102X78X121 (DRAPES) ×2 IMPLANT
DRAPE WARM FLUID 44X44 (DRAPES) ×3 IMPLANT
DRSG COVADERM PLUS 2X2 (GAUZE/BANDAGES/DRESSINGS) IMPLANT
DRSG OPSITE POSTOP 3X4 (GAUZE/BANDAGES/DRESSINGS) IMPLANT
DRSG OPSITE POSTOP 4X10 (GAUZE/BANDAGES/DRESSINGS) ×1 IMPLANT
DRSG TELFA 3X8 NADH (GAUZE/BANDAGES/DRESSINGS) ×3 IMPLANT
DURAPREP 26ML APPLICATOR (WOUND CARE) ×3 IMPLANT
ELECT REM PT RETURN 9FT ADLT (ELECTROSURGICAL) ×3
ELECTRODE REM PT RTRN 9FT ADLT (ELECTROSURGICAL) ×2 IMPLANT
GAUZE 4X4 16PLY ~~LOC~~+RFID DBL (SPONGE) ×6 IMPLANT
GAUZE SPONGE 4X4 12PLY STRL (GAUZE/BANDAGES/DRESSINGS) ×4 IMPLANT
GLOVE SURG ENC MOIS LTX SZ7 (GLOVE) ×8 IMPLANT
GOWN STRL REUS W/TWL LRG LVL3 (GOWN DISPOSABLE) ×9 IMPLANT
HEMOSTAT ARISTA ABSORB 3G PWDR (HEMOSTASIS) ×1 IMPLANT
KIT TURNOVER CYSTO (KITS) ×3 IMPLANT
NDL HYPO 25X1 1.5 SAFETY (NEEDLE) IMPLANT
NDL SAFETY ECLIPSE 18X1.5 (NEEDLE) IMPLANT
NEEDLE HYPO 18GX1.5 SHARP (NEEDLE)
NEEDLE HYPO 22GX1.5 SAFETY (NEEDLE) ×1 IMPLANT
NEEDLE HYPO 25X1 1.5 SAFETY (NEEDLE) IMPLANT
NEEDLE INSUFFLATION 120MM (ENDOMECHANICALS) ×3 IMPLANT
NS IRRIG 1000ML POUR BTL (IV SOLUTION) ×6 IMPLANT
NS IRRIG 500ML POUR BTL (IV SOLUTION) ×4 IMPLANT
PACK ABDOMINAL GYN (CUSTOM PROCEDURE TRAY) ×3 IMPLANT
PACK LAPAROSCOPY BASIN (CUSTOM PROCEDURE TRAY) ×3 IMPLANT
PACK TRENDGUARD 450 HYBRID PRO (MISCELLANEOUS) IMPLANT
PAD DRESSING TELFA 3X8 NADH (GAUZE/BANDAGES/DRESSINGS) ×2 IMPLANT
PAD OB MATERNITY 4.3X12.25 (PERSONAL CARE ITEMS) ×3 IMPLANT
PAD PREP 24X48 CUFFED NSTRL (MISCELLANEOUS) ×3 IMPLANT
PENCIL SMOKE EVAC W/HOLSTER (ELECTROSURGICAL) ×1 IMPLANT
RETRACTOR WND ALEXIS 18 MED (MISCELLANEOUS) IMPLANT
RTRCTR WOUND ALEXIS 18CM MED (MISCELLANEOUS) ×3
RTRCTR WOUND ALEXIS 18CM SML (INSTRUMENTS)
SAVER CELL AAL HAEMONETICS (INSTRUMENTS) IMPLANT
SCISSORS LAP 5X35 DISP (ENDOMECHANICALS) IMPLANT
SEALER TISSUE G2 CVD JAW 45CM (ENDOMECHANICALS) IMPLANT
SET SUCTION IRRIG HYDROSURG (IRRIGATION / IRRIGATOR) IMPLANT
SET TUBE SMOKE EVAC HIGH FLOW (TUBING) ×3 IMPLANT
SHEARS HARMONIC ACE PLUS 45CM (MISCELLANEOUS) IMPLANT
SPONGE T-LAP 18X18 ~~LOC~~+RFID (SPONGE) ×6 IMPLANT
STAPLER VISISTAT 35W (STAPLE) ×3 IMPLANT
STRIP CLOSURE SKIN 1/2X4 (GAUZE/BANDAGES/DRESSINGS) ×1 IMPLANT
STRIP CLOSURE SKIN 1/4X4 (GAUZE/BANDAGES/DRESSINGS) ×3 IMPLANT
SUT CHROMIC 0 CT 1 (SUTURE) ×3 IMPLANT
SUT MNCRL AB 3-0 PS2 27 (SUTURE) ×1 IMPLANT
SUT MNCRL AB 4-0 PS2 18 (SUTURE) ×3 IMPLANT
SUT PDS AB 0 CT1 27 (SUTURE) ×6 IMPLANT
SUT VIC AB 0 CT1 18XCR BRD8 (SUTURE) ×2 IMPLANT
SUT VIC AB 0 CT1 8-18 (SUTURE) ×6
SUT VIC AB 3-0 CT1 27 (SUTURE) ×3
SUT VIC AB 3-0 CT1 TAPERPNT 27 (SUTURE) ×2 IMPLANT
SUT VICRYL 0 TIES 12 18 (SUTURE) ×3 IMPLANT
SUT VICRYL 0 UR6 27IN ABS (SUTURE) ×1 IMPLANT
SYR 10ML LL (SYRINGE) ×3 IMPLANT
SYR BULB IRRIG 60ML STRL (SYRINGE) ×4 IMPLANT
SYR CONTROL 10ML LL (SYRINGE) IMPLANT
SYS BAG RETRIEVAL 10MM (BASKET)
SYSTEM BAG RETRIEVAL 10MM (BASKET) IMPLANT
TOWEL OR 17X26 10 PK STRL BLUE (TOWEL DISPOSABLE) ×6 IMPLANT
TRENDGUARD 450 HYBRID PRO PACK (MISCELLANEOUS) ×3
TROCAR OPTI TIP 5M 100M (ENDOMECHANICALS) ×3 IMPLANT
TROCAR XCEL BLUNT TIP 100MML (ENDOMECHANICALS) IMPLANT
TROCAR XCEL DIL TIP R 11M (ENDOMECHANICALS) ×3 IMPLANT
TUBING CONNECTING 10 (TUBING) ×1 IMPLANT
WARMER LAPAROSCOPE (MISCELLANEOUS) ×3 IMPLANT
WATER STERILE IRR 1000ML POUR (IV SOLUTION) ×3 IMPLANT
YANKAUER SUCT BULB TIP NO VENT (SUCTIONS) ×1 IMPLANT

## 2021-05-12 NOTE — Anesthesia Preprocedure Evaluation (Addendum)
Anesthesia Evaluation  Patient identified by MRN, date of birth, ID band Patient awake    Reviewed: Allergy & Precautions, NPO status , Patient's Chart, lab work & pertinent test results  Airway Mallampati: I  TM Distance: >3 FB Neck ROM: Full    Dental  (+) Teeth Intact, Dental Advisory Given   Pulmonary neg pulmonary ROS,    breath sounds clear to auscultation       Cardiovascular hypertension, Pt. on medications  Rhythm:Regular Rate:Normal     Neuro/Psych negative neurological ROS  negative psych ROS   GI/Hepatic negative GI ROS, Neg liver ROS,   Endo/Other    Renal/GU negative Renal ROS     Musculoskeletal negative musculoskeletal ROS (+)   Abdominal Normal abdominal exam  (+)   Peds  Hematology negative hematology ROS (+)   Anesthesia Other Findings   Reproductive/Obstetrics                            Anesthesia Physical Anesthesia Plan  ASA: 3  Anesthesia Plan: General   Post-op Pain Management:    Induction: Intravenous  PONV Risk Score and Plan: 4 or greater and Ondansetron, Dexamethasone, Midazolam and Scopolamine patch - Pre-op  Airway Management Planned: Oral ETT  Additional Equipment: None  Intra-op Plan:   Post-operative Plan: Extubation in OR  Informed Consent: I have reviewed the patients History and Physical, chart, labs and discussed the procedure including the risks, benefits and alternatives for the proposed anesthesia with the patient or authorized representative who has indicated his/her understanding and acceptance.     Dental advisory given  Plan Discussed with: CRNA  Anesthesia Plan Comments: (Lab Results      Component                Value               Date                      WBC                      5.8                 12/13/2020                HGB                      12.2                05/12/2021                HCT                       36.0                05/12/2021                MCV                      80                  12/13/2020                PLT                      242  12/13/2020           )       Anesthesia Quick Evaluation

## 2021-05-12 NOTE — Anesthesia Procedure Notes (Signed)
Procedure Name: Intubation Date/Time: 05/12/2021 7:37 AM Performed by: Rogers Blocker, CRNA Pre-anesthesia Checklist: Patient identified, Emergency Drugs available, Suction available and Patient being monitored Patient Re-evaluated:Patient Re-evaluated prior to induction Oxygen Delivery Method: Circle System Utilized Preoxygenation: Pre-oxygenation with 100% oxygen Induction Type: IV induction Ventilation: Mask ventilation without difficulty Laryngoscope Size: Mac and 3 Grade View: Grade I Tube type: Oral Tube size: 7.0 mm Number of attempts: 1 Airway Equipment and Method: Stylet and Bite block Placement Confirmation: ETT inserted through vocal cords under direct vision, positive ETCO2 and breath sounds checked- equal and bilateral Secured at: 21 cm Tube secured with: Tape Dental Injury: Teeth and Oropharynx as per pre-operative assessment

## 2021-05-12 NOTE — Op Note (Signed)
Operative Note  Kristen Hines  [id number]  [id number]  05/12/2021   Surgeon: Romana Juniper MD   Procedure performed: lysis of adhesions 6min   Preop diagnosis: Pelvic pain, patient is undergoing bilateral oophorectomies with Dr. Matthew Saras and intraoperative consult was requested due to findings of dense pelvic adhesions including essentially fusion of the sigmoid colon to the medial aspect of the right multicystic ovary Post-op diagnosis/intraop findings: Same   Specimens: no Retained items: no  EBL: minimalcc Complications: none   Description of procedure: The patient is under general anesthesia and the abdomen is open through a Pfannenstiel incision.  The cecum and terminal ileum are packed away cephalad and on inspection there are multiple dense adhesions in the pelvis tethering sigmoid colon and rectum anteriorly into the right.  The multicystic right ovary is essentially fused to the sigmoid colon and pelvic sidewall.  A combination of careful sharp and blunt dissection were employed to slowly freed the colon from the ovary, keeping a small amount of ovarian tissue on the colonic wall so as to avoid visceral injury.  This was accomplished sufficiently to allow the right ovary to be resected.  The colon was inspected closely and confirmed to be free of injury.  The case was then turned back over to Dr. Matthew Saras.

## 2021-05-12 NOTE — Transfer of Care (Signed)
Immediate Anesthesia Transfer of Care Note  Patient: Kristen Hines  Procedure(s) Performed: LAPAROSCOPY DIAGNOSTIC EXPLORATORY LAPAROTOMY, LYSIS OF ADHESIONS, BILATERAL salpingo-OOPHERECTOMU (Bilateral)  Patient Location: PACU  Anesthesia Type:General  Level of Consciousness: awake, patient cooperative and responds to stimulation  Airway & Oxygen Therapy: Patient Spontanous Breathing and Patient connected to face mask oxygen  Post-op Assessment: Report given to RN and Post -op Vital signs reviewed and stable  Post vital signs: Reviewed and stable  Last Vitals:  Vitals Value Taken Time  BP 152/91 05/12/21 1002  Temp    Pulse 65 05/12/21 1006  Resp 24 05/12/21 1006  SpO2 100 % 05/12/21 1006  Vitals shown include unvalidated device data.  Last Pain:  Vitals:   05/12/21 0552  TempSrc: Oral      Patients Stated Pain Goal: 6 (87/68/11 5726)  Complications: No notable events documented.

## 2021-05-12 NOTE — Op Note (Signed)
Preoperative diagnosis: Chronic pelvic pain, right ovarian cyst  Postoperative diagnosis multicystic right ovary, severe dense bilateral adnexal adhesions  Surgeon: Matthew Saras  Assistant: [place]  Intraoperative consult: General surgery  EBL: 150 cc  Procedure and findings patient taken to the operating room after an adequate level of general anesthesia was obtained with the patient's legs in stirrups the abdomen perineum and vagina were prepped and draped, Foley catheter was position.  This was done after appropriate timeouts were taken.  Attention directed to the abdomen the subumbilical area was infiltrated with quarter percent Marcaine plain, small incision was made and the varies needle was introduced without difficulty.  Its intra-abdominal position was verified by pressure and water testing.  After a 3 L pneumoperitoneum was then created, laparoscopic trocar and sleeve were then introduced without difficulty.  There was no evidence of bleeding or trauma.  With the patient in Trendelenburg I could get a view of the pelvis that did show significant dense bilateral adnexal adhesions.  Decision made to proceed with laparotomy at that point.  The legs were lowered slightly, Pfannenstiel incision made through her old Pfannenstiel scar this was carried down the fascia which was incised and extended transversely rectus muscle divided in the midline, peritoneum entered superiorly without incident and extended in a vertical fashion.  Omental adhesions along the right anterior abdominal wall were lysed in an avascular plane.  This was hemostatic.  O'Connor-O'Sullivan retractor was in position the bowels packed superiorly out of the field.  Initial inspection revealed substantial dense adhesions with the sigmoid colon actually pulled over into the right side so intraoperative consult with general surgery was initiated.  On the left side I could identify the ovary that did have some significant adhesions  decision made to proceed with BSO.  This was placed on traction with Babcock clamp, Metzenbaum scissors was then used to dissect the ovary free until it was mobilized.  Retroperitoneal space on that side was then entered, the left IP ligament ligament was isolated, the course of the ureter was well below, left IP ligament was then clamped divided doubly free tied with 0 Vicryl suture.  Remainder of the ovary was then removed with a clamped cut tie technique.  At this point general surgery was scrubbed then, their note is dictated separately but we was able to free up the colon from the right sidewall in the area on top of the right ovary which had multiple small cyst noted.  Once this was freed up and they had to look at the bowel carefully and all was intact, the scrubbed out.  The right round ligament was clamped divided suture-ligated retroperitoneal peritoneal space on that side was developed, the right IP ligament was isolated, course of the ureter was well below, right IP ligament was doubly clamped divided first free tie followed by suture ligature 0 Vicryl.  Remainder of the right ovary was dissected with a clamped cut tied technique and removed.  These areas were then irrigated with saline and aspirated and the operative sites noted to be hemostatic Arista powder was placed in each sidewall.  Prior to closure sponge, needle, instrument counts reported as correct x2.  Peritoneum closed with a running 2-0 Vicryl suture the same used to reapproximate the rectus muscles in the midline.  Diluted Exparel with Marcaine was then injected subfascially and subcutaneously for postoperative analgesia.  Fascia closed from laterally to midline with a 0 PDS suture.  Subcutaneous tissue was irrigated hemostatic gauze was dissected to release tension  and this was made hemostatic with the Bovie.  Reapproximated at the skin level with a subcuticular 3-0 Vicryl/Monocryl suture with good approximation Steri-Strips were  applied and a pressure dressing applied likewise.  Clear urine noted at the end of the case she tolerated this well went to recovery room in good condition.  Dictated with [employer]  Kristen Bossier MD

## 2021-05-12 NOTE — Anesthesia Postprocedure Evaluation (Signed)
Anesthesia Post Note  Patient: Kristen Hines  Procedure(s) Performed: LAPAROSCOPY DIAGNOSTIC EXPLORATORY LAPAROTOMY, LYSIS OF ADHESIONS, BILATERAL salpingo-OOPHERECTOMU (Bilateral)     Patient location during evaluation: PACU Anesthesia Type: General Level of consciousness: awake and alert Pain management: pain level controlled Vital Signs Assessment: post-procedure vital signs reviewed and stable Respiratory status: spontaneous breathing, nonlabored ventilation, respiratory function stable and patient connected to nasal cannula oxygen Cardiovascular status: blood pressure returned to baseline and stable Postop Assessment: no apparent nausea or vomiting Anesthetic complications: no   No notable events documented.  Last Vitals:  Vitals:   05/12/21 1234 05/12/21 1606  BP: 117/83 (!) 162/109  Pulse: 65 65  Resp: 16 18  Temp: 36.5 C 36.6 C  SpO2: 100% 100%    Last Pain:  Vitals:   05/12/21 1606  TempSrc:   PainSc: [place]

## 2021-05-12 NOTE — Progress Notes (Signed)
The patient was re-examined with no change in status , F/U US in office last week shows dimunition of larger cyst>>2 smaller cysts on Right

## 2021-05-13 ENCOUNTER — Encounter (HOSPITAL_BASED_OUTPATIENT_CLINIC_OR_DEPARTMENT_OTHER): Payer: Self-pay | Admitting: Obstetrics and Gynecology

## 2021-05-13 DIAGNOSIS — N83291 Other ovarian cyst, right side: Secondary | ICD-10-CM | POA: Diagnosis not present

## 2021-05-13 LAB — CBC
HCT: 27.4 % — ABNORMAL LOW (ref 36.0–46.0)
Hemoglobin: 8.7 g/dL — ABNORMAL LOW (ref 12.0–15.0)
MCH: 26.5 pg (ref 26.0–34.0)
MCHC: 31.8 g/dL (ref 30.0–36.0)
MCV: 83.5 fL (ref 80.0–100.0)
Platelets: 221 10*3/uL (ref 150–400)
RBC: 3.28 MIL/uL — ABNORMAL LOW (ref 3.87–5.11)
RDW: 14.1 % (ref 11.5–15.5)
WBC: 13.1 10*3/uL — ABNORMAL HIGH (ref 4.0–10.5)
nRBC: 0 % (ref 0.0–0.2)

## 2021-05-13 MED ORDER — KETOROLAC TROMETHAMINE 30 MG/ML IJ SOLN
INTRAMUSCULAR | Status: AC
  Filled 2021-05-13: qty 1

## 2021-05-13 MED ORDER — HYDROCODONE-ACETAMINOPHEN 5-325 MG PO TABS
ORAL_TABLET | ORAL | Status: AC
  Filled 2021-05-13: qty 1

## 2021-05-13 MED ORDER — ONDANSETRON HCL 4 MG PO TABS: 4.0000 mg | ORAL_TABLET | Freq: Four times a day (QID) | ORAL | 0 refills | Status: AC | PRN

## 2021-05-13 MED ORDER — IBUPROFEN 600 MG PO TABS: 600.0000 mg | ORAL_TABLET | Freq: Four times a day (QID) | ORAL | 0 refills | Status: AC

## 2021-05-13 MED ORDER — HYDROCODONE-ACETAMINOPHEN 5-325 MG PO TABS
1.0000 | ORAL_TABLET | ORAL | 0 refills | Status: DC | PRN
Start: 2021-05-13 — End: 2021-08-26

## 2021-05-13 NOTE — Progress Notes (Signed)
POD 1 Pt conversant, hungry, has voided  BP (!) 98/56 (BP Location: Right Arm)    Pulse 65    Temp 98.3 F (36.8 C)    Resp 20    Ht 5\' 6"  (1.676 m)    Wt 110.8 kg    LMP 04/02/2015 (Exact Date)    SpO2 100%    BMI 39.41 kg/m  CBC    Component Value Date/Time   WBC 13.1 (H) 05/13/2021 0037   RBC 3.28 (L) 05/13/2021 0037   HGB 8.7 (L) 05/13/2021 0037   HGB 11.3 12/13/2020 1028   HCT 27.4 (L) 05/13/2021 0037   HCT 35.5 12/13/2020 1028   PLT 221 05/13/2021 0037   PLT 242 12/13/2020 1028   MCV 83.5 05/13/2021 0037   MCV 80 12/13/2020 1028   MCH 26.5 05/13/2021 0037   MCHC 31.8 05/13/2021 0037   RDW 14.1 05/13/2021 0037   RDW 13.6 12/13/2020 1028   Abd soft + BS>>.dressing dry  Discussed D/C today and precautions/Rx.Office Spirometry Results:   Will f/u with her in am

## 2021-05-13 NOTE — Discharge Summary (Signed)
Physician Discharge Summary  Patient ID: JYLIAN PAPPALARDO MRN: [id number] DOB/AGE: November 19, 1980 41 y.o.  Admit date: 05/12/2021 Discharge date: 05/13/2021  Admission Diagnoses:  Discharge Diagnoses:  Principal Problem:   Right ovarian cyst Active Problems:   Pelvic pain   Discharged Condition: good  Hospital Course: adm for DL for R ov cyst/pain>>>DL showed signig bilat adnexal adhesions requiring EL w/ BSO, LOA and gen surg intra op concult On POD 1 was tol PO, afeb, ambulating and ready for D/C  Consults: general surgery  Significant Diagnostic Studies: labs:  Results for orders placed or performed during the hospital encounter of 05/12/21 (from the past 24 hour(s))  CBC     Status: Abnormal   Collection Time: 05/13/21 12:37 AM  Result Value Ref Range   WBC 13.1 (H) 4.0 - 10.5 K/uL   RBC 3.28 (L) 3.87 - 5.11 MIL/uL   Hemoglobin 8.7 (L) 12.0 - 15.0 g/dL   HCT 27.4 (L) 36.0 - 46.0 %   MCV 83.5 80.0 - 100.0 fL   MCH 26.5 26.0 - 34.0 pg   MCHC 31.8 30.0 - 36.0 g/dL   RDW 14.1 11.5 - 15.5 %   Platelets 221 150 - 400 K/uL   nRBC 0.0 0.0 - 0.2 %     Treatments: surgery: DL>>EL w/ BSO/LOA  Discharge Exam: Blood pressure (!) 98/56, pulse 65, temperature 98.3 F (36.8 C), resp. rate 20, height 5\' 6"  (1.676 m), weight 110.8 kg, last menstrual period 04/02/2015, SpO2 100 %. General appearance: alert Abd + BS, Inc C/D  Disposition: Discharge disposition: 01-Home or [place], MD Follow up.   Specialty: Obstetrics and Gynecology Why: office will call for appts Contact information: [place] [address] [phone]                 Signed: Margarette Asal 05/13/2021, 7:37 AM

## 2021-05-14 LAB — SURGICAL PATHOLOGY

## 2021-05-25 ENCOUNTER — Other Ambulatory Visit: Payer: 59

## 2021-08-22 NOTE — Progress Notes (Signed)
Patient ID: Kristen Hines, female    DOB: Apr 12, 1980  MRN: [id number]  CC: Hypertension Follow-Up  Subjective: Kristen Hines is a 41 y.o. female who presents for hypertension follow-up.   Her concerns today include:  Hypertension follow-up: 04/09/2021: - Continue Lisinopril and Hydrochlorothiazide as prescribed.   08/26/2021: Doing well on current regimen. No side effects. No issues/concerns. Denies chest pain, shortness of breath, worst headache of life and additional red flag symptoms.   2. Anemia: Recent appointment at Gynecology ([facility]). Reports she has anemia, unsure of cause. Prescribed iron supplements and instructed to have anemia recheck at PCP.  3. Weight management: Ready to resume Phentermine. No issues/concerns.  Patient Active Problem List   Diagnosis Date Noted   Other abnormal findings in specimens from other organs, systems and tissues 08/26/2021   Anemia 08/26/2021   Obesity 08/26/2021   Right ovarian cyst 05/12/2021   Pelvic pain 05/12/2021   Essential hypertension 01/11/2021   Bacterial vaginitis 12/16/2020   Prediabetes 12/14/2020   History of total abdominal hysterectomy 10/08/2020   At high risk for breast cancer 09/14/2017   Genetic testing 07/16/2017   Family history of breast cancer    Family history of pancreatic cancer    Abnormal uterine bleeding 04/16/2015     Current Outpatient Medications on File Prior to Visit  Medication Sig Dispense Refill   estradiol (VIVELLE-DOT) 0.1 MG/24HR patch 1 patch 2 (two) times a week.     ibuprofen (ADVIL) 600 MG tablet Take 1 tablet (600 mg total) by mouth every 6 (six) hours. 30 tablet 0   Multiple Vitamin (MULTIVITAMIN ADULT PO) Take by mouth.     ondansetron (ZOFRAN) 4 MG tablet Take 1 tablet (4 mg total) by mouth every 6 (six) hours as needed for nausea. 20 tablet 0   polyethylene glycol powder (GLYCOLAX/MIRALAX) 17 GM/SCOOP powder Take 255 g by mouth daily. 255 g 3   No current  facility-administered medications on file prior to visit.    No Known Allergies  Social History   Socioeconomic History   Marital status: Single    Spouse name: Not on file   Number of children: Not on file   Years of education: Not on file   Highest education level: Not on file  Occupational History   Not on file  Tobacco Use   Smoking status: Never   Smokeless tobacco: Never  Vaping Use   Vaping Use: Never used  Substance and Sexual Activity   Alcohol use: Yes    Comment: SOCIAL   Drug use: No   Sexual activity: Yes    Birth control/protection: Surgical  Other Topics Concern   Not on file  Social History Narrative   Not on file   Social Determinants of Health   Financial Resource Strain: Not on file  Food Insecurity: Not on file  Transportation Needs: Not on file  Physical Activity: Not on file  Stress: Not on file  Social Connections: Not on file  Intimate Partner Violence: Not on file    Family History  Problem Relation Age of Onset   Hypertension Mother    Breast cancer Mother 63       currently 72   Hypertension Father    Heart disease Father    Diabetes Father    Pancreatic cancer Maternal Aunt        diagnosed 79s; currently 4s   Prostate cancer Maternal Grandfather 12       currently 24s  Past Surgical History:  Procedure Laterality Date   ABDOMINAL HYSTERECTOMY Bilateral 04/16/2015   Procedure: HYSTERECTOMY ABDOMINAL WITH BILATERAL SALPINGECTOMY;  Surgeon: Molli Posey, MD;  Location: [facility];  Service: Gynecology;  Laterality: Bilateral;   LAPAROSCOPIC VAGINAL HYSTERECTOMY WITH SALPINGECTOMY Bilateral 04/16/2015   Procedure:  ATTEMPTED LAPAROSCOPIC ASSISTED VAGINAL HYSTERECTOMY WITH BILATERAL SALPINGECTOMY;  Surgeon: Molli Posey, MD;  Location: [place];  Service: Gynecology;  Laterality: Bilateral;   LAPAROSCOPY N/A 05/12/2021   Procedure: LAPAROSCOPY DIAGNOSTIC;  Surgeon: Molli Posey, MD;   Location: [facility];  Service: Gynecology;  Laterality: N/A;   LAPAROSCOPY BILATERAL TUBAL WITH FILCHIE CLIPS/   D & C HYSTEROSCOPY ENDOMETRIAL NOVASURE ABLATION  03-01-2009   LAPAROTOMY Bilateral 05/12/2021   Procedure: EXPLORATORY LAPAROTOMY, LYSIS OF ADHESIONS, BILATERAL salpingo-OOPHERECTOMU;  Surgeon: Molli Posey, MD;  Location: [facility];  Service: Gynecology;  Laterality: Bilateral;    ROS: Review of Systems Negative except as stated above  PHYSICAL EXAM: BP 117/82 (BP Location: Left Arm, Patient Position: Sitting, Cuff Size: Large)   Pulse 64   Temp 98.3 F (36.8 C)   Resp 16   Wt 240 lb (108.9 kg)   LMP 04/02/2015 (Exact Date)   SpO2 99%   BMI 38.74 kg/m   Physical Exam HENT:     Head: Normocephalic and atraumatic.  Eyes:     Extraocular Movements: Extraocular movements intact.     Conjunctiva/sclera: Conjunctivae normal.     Pupils: Pupils are equal, round, and reactive to light.  Cardiovascular:     Rate and Rhythm: Normal rate and regular rhythm.     Pulses: Normal pulses.     Heart sounds: Normal heart sounds.  Pulmonary:     Effort: Pulmonary effort is normal.     Breath sounds: Normal breath sounds.  Musculoskeletal:     Cervical back: Normal range of motion and neck supple.  Neurological:     General: No focal deficit present.     Mental Status: She is alert and oriented to person, place, and time.  Psychiatric:        Mood and Affect: Mood normal.        Behavior: Behavior normal.    ASSESSMENT AND PLAN: 1. Essential (primary) hypertension - Continue Lisinopril and Hydrochlorothiazide as prescribed.  - Counseled on blood pressure goal of less than 130/80, low-sodium, DASH diet, medication compliance, and 150 minutes of moderate intensity exercise per week as tolerated. Counseled on medication adherence and adverse effects. - Update BMP.  - Follow-up with primary provider in 4 months or sooner if needed.  -  Basic Metabolic Panel - lisinopril (ZESTRIL) 10 MG tablet; Take 1 tablet (10 mg total) by mouth daily.  Dispense: 120 tablet; Refill: 0 - hydrochlorothiazide (HYDRODIURIL) 12.5 MG tablet; Take 1 tablet (12.5 mg total) by mouth daily.  Dispense: 120 tablet; Refill: 0  2. Prediabetes - Update hemoglobin A1c. - Hemoglobin A1c  3. Screening cholesterol level - Update lipid panel. - Lipid panel  4. Screening for deficiency anemia - Recheck anemia levels.  - CBC - Iron, TIBC and Ferritin Panel  5. Encounter for weight management - Phentermine as prescribed. Counseled on medication adherence and adverse effects.  - Follow-up with primary provider in 4 weeks or sooner if needed.  - phentermine 15 MG capsule; Take 1 capsule (15 mg total) by mouth every morning.  Dispense: 30 capsule; Refill: 0     Patient was given the opportunity to ask questions.  Patient verbalized understanding of the plan and was able to repeat key elements of the plan. Patient was given clear instructions to go to Emergency Department or return to medical center if symptoms don't improve, worsen, or new problems develop.The patient verbalized understanding.   Orders Placed This Encounter  Procedures   Lipid panel   Basic Metabolic Panel   Hemoglobin A1c   CBC   Iron, TIBC and Ferritin Panel     Requested Prescriptions   Signed Prescriptions Disp Refills   phentermine 15 MG capsule 30 capsule 0    Sig: Take 1 capsule (15 mg total) by mouth every morning.   lisinopril (ZESTRIL) 10 MG tablet 120 tablet 0    Sig: Take 1 tablet (10 mg total) by mouth daily.   hydrochlorothiazide (HYDRODIURIL) 12.5 MG tablet 120 tablet 0    Sig: Take 1 tablet (12.5 mg total) by mouth daily.    Return in about 4 months (around 12/26/2021) for Follow-Up or next available HTN and 4 weeks weight check .  [name], NP

## 2021-08-26 ENCOUNTER — Ambulatory Visit (INDEPENDENT_AMBULATORY_CARE_PROVIDER_SITE_OTHER): Payer: 59 | Admitting: Family

## 2021-08-26 VITALS — BP 117/82 | HR 64 | Temp 98.3°F | Resp 16 | Wt 240.0 lb

## 2021-08-26 DIAGNOSIS — E669 Obesity, unspecified: Secondary | ICD-10-CM | POA: Insufficient documentation

## 2021-08-26 DIAGNOSIS — Z7689 Persons encountering health services in other specified circumstances: Secondary | ICD-10-CM

## 2021-08-26 DIAGNOSIS — R7303 Prediabetes: Secondary | ICD-10-CM

## 2021-08-26 DIAGNOSIS — I10 Essential (primary) hypertension: Secondary | ICD-10-CM | POA: Diagnosis not present

## 2021-08-26 DIAGNOSIS — D649 Anemia, unspecified: Secondary | ICD-10-CM | POA: Insufficient documentation

## 2021-08-26 DIAGNOSIS — Z13220 Encounter for screening for lipoid disorders: Secondary | ICD-10-CM

## 2021-08-26 DIAGNOSIS — Z130 Encounter for screening for diseases of the blood and blood-forming organs and certain disorders involving the immune mechanism: Secondary | ICD-10-CM

## 2021-08-26 DIAGNOSIS — R898 Other abnormal findings in specimens from other organs, systems and tissues: Secondary | ICD-10-CM | POA: Insufficient documentation

## 2021-08-26 MED ORDER — PHENTERMINE HCL 15 MG PO CAPS: 15.0000 mg | ORAL_CAPSULE | ORAL | 0 refills | Status: DC

## 2021-08-26 MED ORDER — HYDROCHLOROTHIAZIDE 12.5 MG PO TABS: 12.5000 mg | ORAL_TABLET | Freq: Every day | ORAL | 0 refills | Status: DC

## 2021-08-26 MED ORDER — LISINOPRIL 10 MG PO TABS: 10.0000 mg | ORAL_TABLET | Freq: Every day | ORAL | 0 refills | Status: DC

## 2021-08-26 NOTE — Progress Notes (Signed)
.  Pt presents for hypertension f/u, wants to start back on Phentermine

## 2021-08-27 LAB — IRON,TIBC AND FERRITIN PANEL
Ferritin: 129 ng/mL (ref 15–150)
Iron Saturation: 20 % (ref 15–55)
Iron: 47 ug/dL (ref 27–159)
Total Iron Binding Capacity: 241 ug/dL — ABNORMAL LOW (ref 250–450)
UIBC: 194 ug/dL (ref 131–425)

## 2021-08-27 LAB — CBC
Hematocrit: 33.2 % — ABNORMAL LOW (ref 34.0–46.6)
Hemoglobin: 10.3 g/dL — ABNORMAL LOW (ref 11.1–15.9)
MCH: 25 pg — ABNORMAL LOW (ref 26.6–33.0)
MCHC: 31 g/dL — ABNORMAL LOW (ref 31.5–35.7)
MCV: 81 fL (ref 79–97)
Platelets: 270 10*3/uL (ref 150–450)
RBC: 4.12 x10E6/uL (ref 3.77–5.28)
RDW: 13.6 % (ref 11.7–15.4)
WBC: 5.8 10*3/uL (ref 3.4–10.8)

## 2021-08-28 ENCOUNTER — Encounter: Payer: Self-pay | Admitting: Family

## 2021-08-29 IMAGING — CT CT ABD-PELV W/ CM
2 of 5 series · 16 of 46 positions shown, 18 images · IV contrast (omnipaque)
Comparison: None.

CLINICAL DATA: Right lower quadrant abdomen pain

EXAM:
CT ABDOMEN AND PELVIS WITH CONTRAST
TECHNIQUE: Multidetector CT imaging of the abdomen and pelvis was performed
using the standard protocol following bolus administration of
intravenous contrast.
CONTRAST:  100mL OMNIPAQUE IOHEXOL 300 MG/ML  SOLN

[Series 2: axial st · axial · 0.82mm/px · z∈[-462,-38]mm · 13 of 99 slices shown, 15 images]
[im 7/99  soft-tissue]
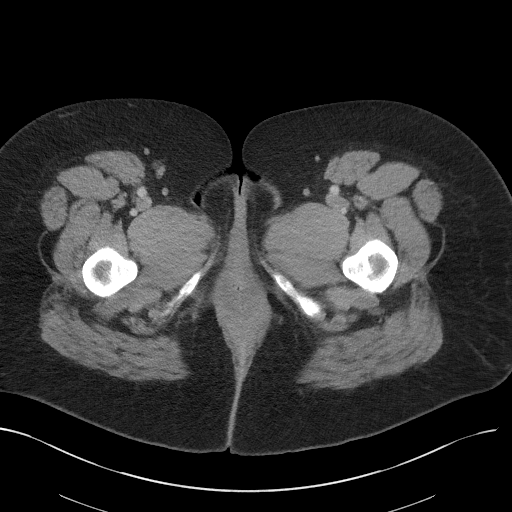
[im 7/99  bone]
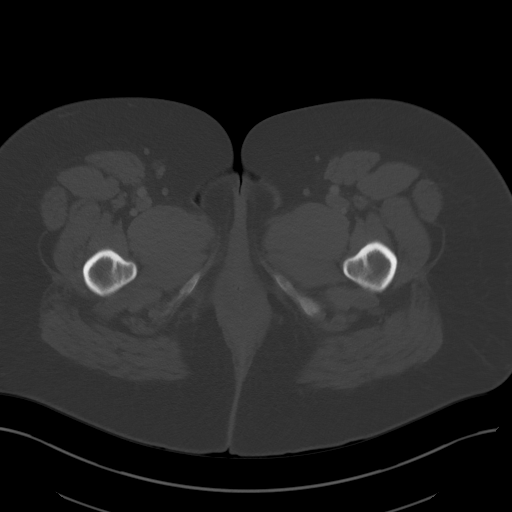
[im 14/99  soft-tissue]
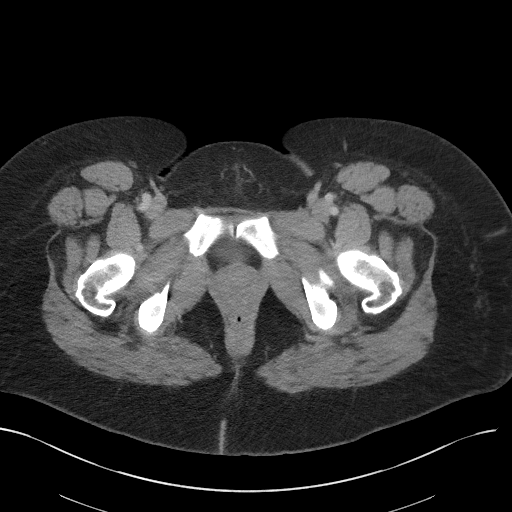
[im 20/99  soft-tissue]
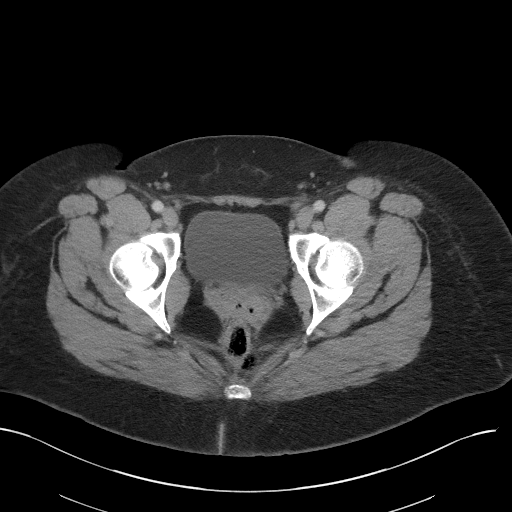
[im 27/99  soft-tissue]
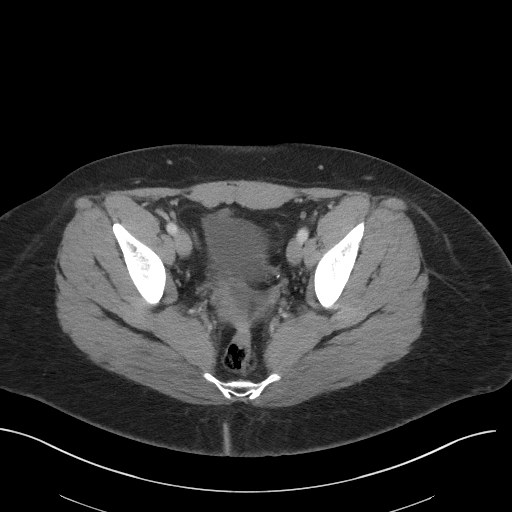
[im 33/99  soft-tissue]
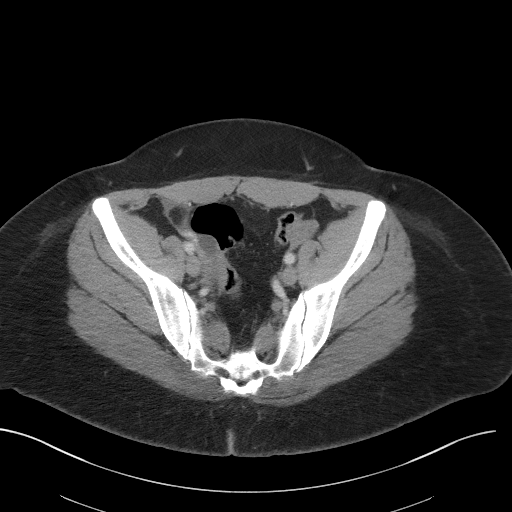
[im 40/99  soft-tissue]
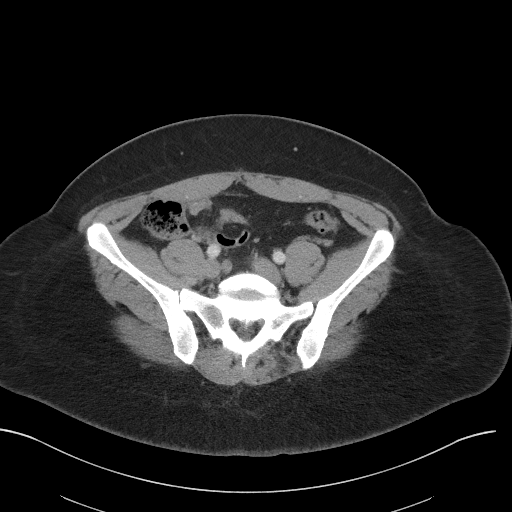
[im 53/99  soft-tissue]
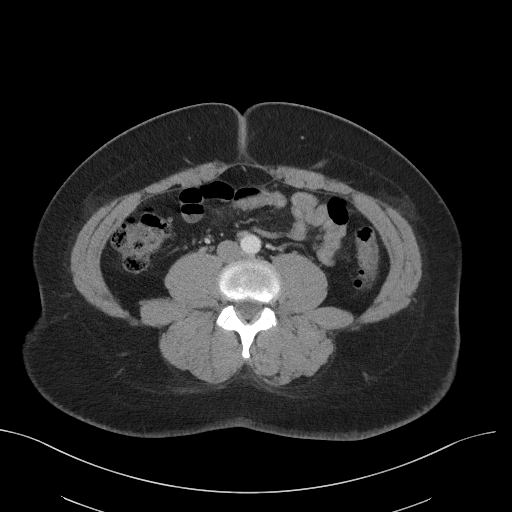
[im 59/99  soft-tissue]
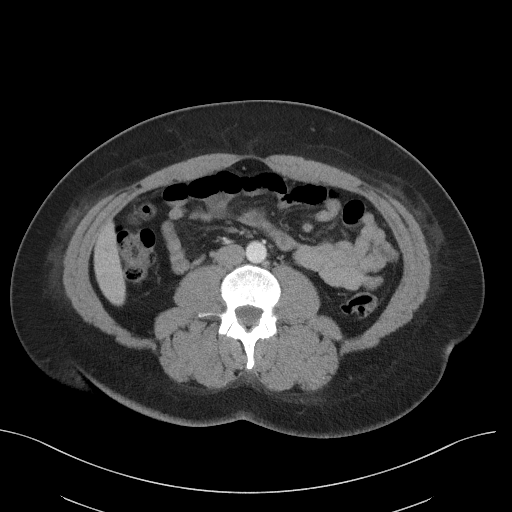
[im 66/99  soft-tissue]
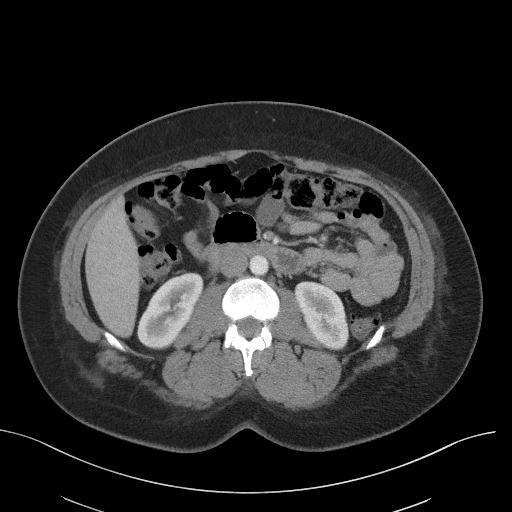
[im 66/99  bone]
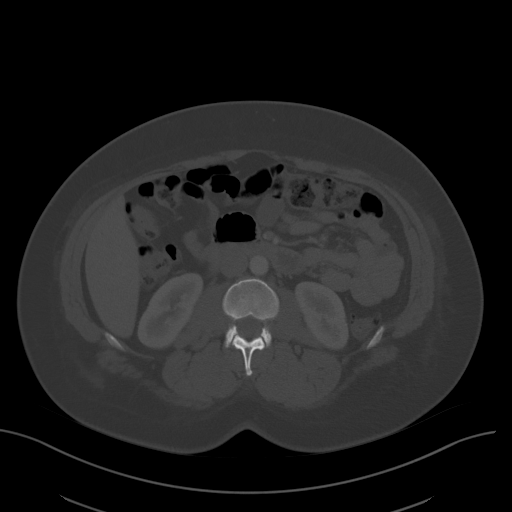
[im 72/99  soft-tissue]
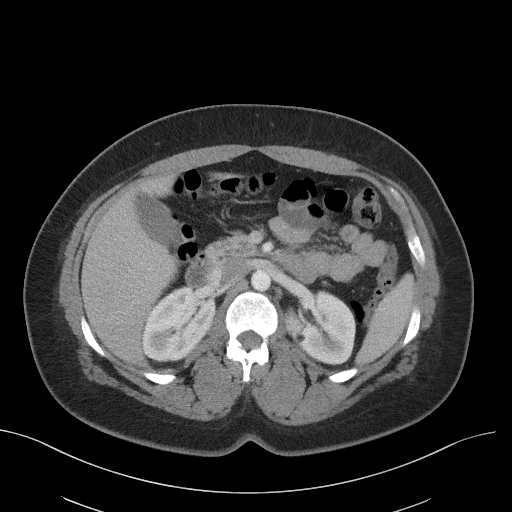
[im 79/99  soft-tissue]
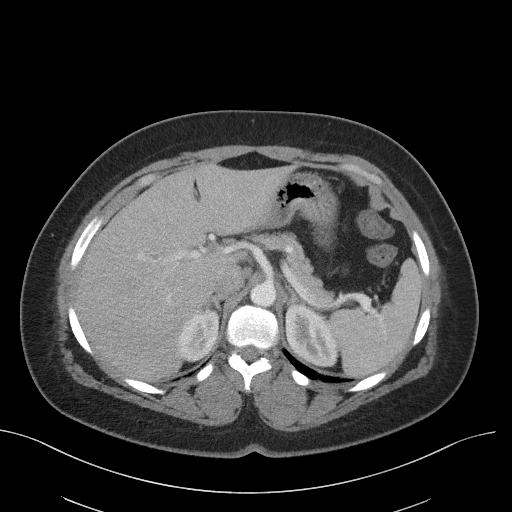
[im 85/99  soft-tissue]
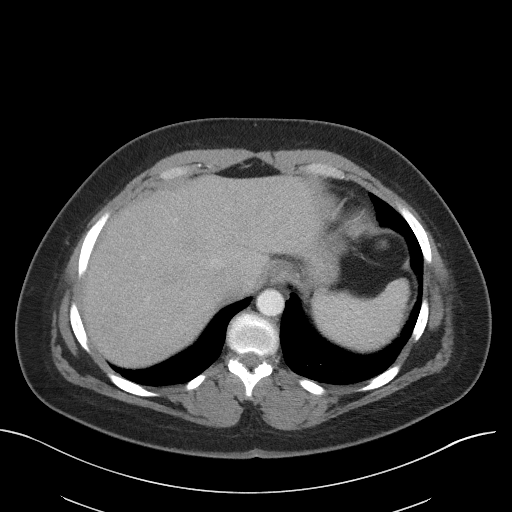
[im 92/99  soft-tissue]
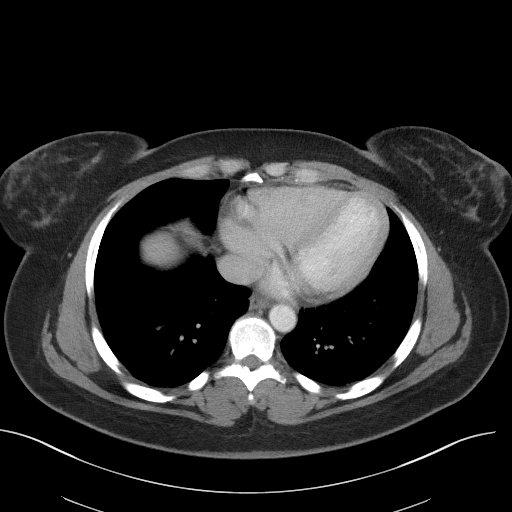

[Series 5: coronal st · coronal · 0.80mm/px · 3 of 101 slices shown]
[im 34/101  soft-tissue]
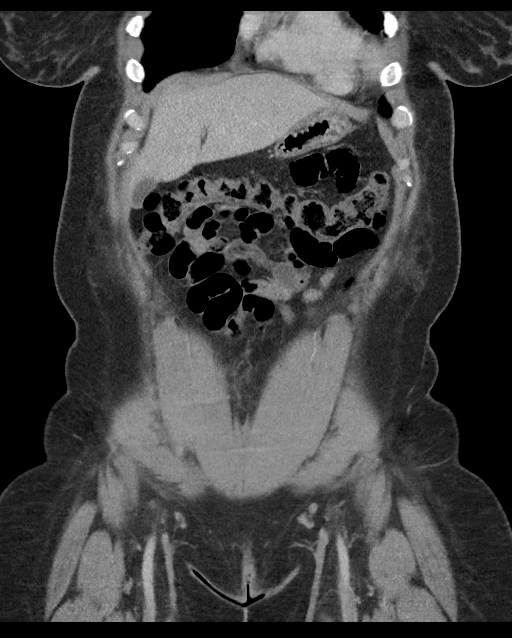
[im 45/101  soft-tissue]
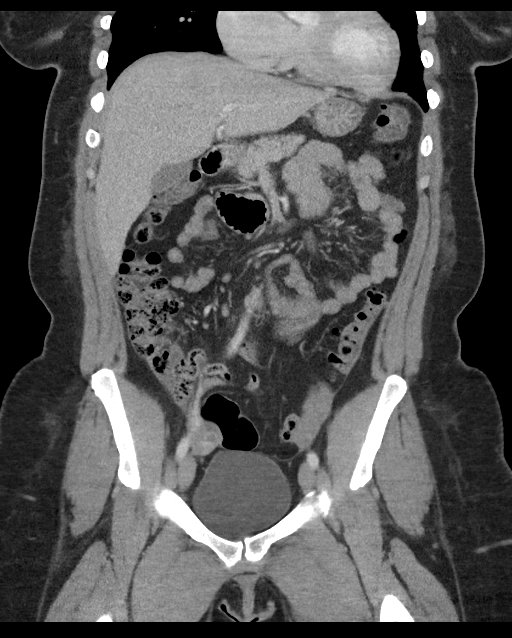
[im 56/101  soft-tissue]
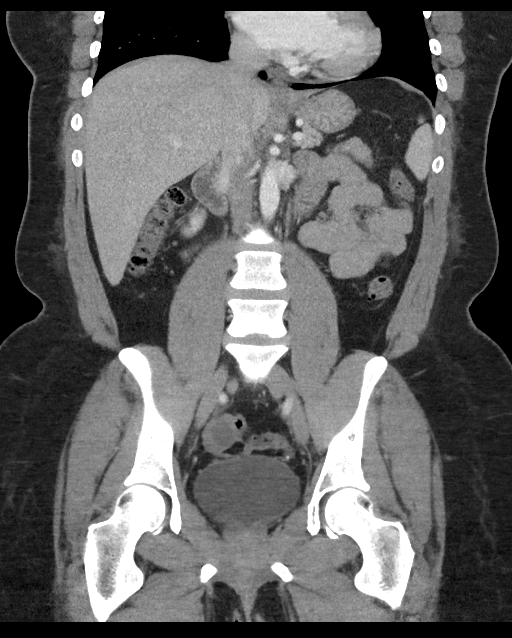

[16 of 46 positions shown; findings below may reference images not displayed]

FINDINGS: Lower chest: No acute abnormality.

Hepatobiliary: No focal liver abnormality is seen. No gallstones,
gallbladder wall thickening, or biliary dilatation.

Pancreas: Unremarkable. No pancreatic ductal dilatation or
surrounding inflammatory changes.

Spleen: Normal in size without focal abnormality.

Adrenals/Urinary Tract: Adrenal glands are unremarkable. Kidneys are
normal, without renal calculi, focal lesion, or hydronephrosis.
Bladder is unremarkable.

Stomach/Bowel: Stomach is within normal limits. Appendix appears
normal. No evidence of bowel wall thickening, distention, or
inflammatory changes.

Vascular/Lymphatic: No significant vascular findings are present. No
enlarged abdominal or pelvic lymph nodes.

Reproductive: The uterus is normal. Right ovarian cysts are
identified, likely follicular cyst. The left adnexa is normal.

Other: None.

Musculoskeletal: Degenerative joint changes of lower lumbar spine
are noted.
IMPRESSION: 1. No acute abnormality identified in the abdomen and pelvis. The
appendix is normal.
2. Right ovarian cysts, likely follicular cyst.

## 2021-09-02 LAB — BASIC METABOLIC PANEL
BUN/Creatinine Ratio: 12 (ref 9–23)
BUN: 11 mg/dL (ref 6–24)
CO2: 23 mmol/L (ref 20–29)
Calcium: 9.3 mg/dL (ref 8.7–10.2)
Chloride: 99 mmol/L (ref 96–106)
Creatinine, Ser: 0.9 mg/dL (ref 0.57–1.00)
Glucose: 88 mg/dL (ref 70–99)
Potassium: 4.1 mmol/L (ref 3.5–5.2)
Sodium: 139 mmol/L (ref 134–144)
eGFR: 82 mL/min/{1.73_m2} (ref >59)

## 2021-09-02 LAB — LIPID PANEL
Chol/HDL Ratio: 3.6 ratio (ref 0.0–4.4)
Cholesterol, Total: 203 mg/dL — ABNORMAL HIGH (ref 100–199)
HDL: 57 mg/dL (ref >39)
LDL Chol Calc (NIH): 127 mg/dL — ABNORMAL HIGH (ref 0–99)
Triglycerides: 109 mg/dL (ref 0–149)
VLDL Cholesterol Cal: 19 mg/dL (ref 5–40)

## 2021-09-15 NOTE — Progress Notes (Signed)
Patient ID: Kristen Hines, female    DOB: 1980/11/16  MRN: [id number]  CC: Weight Check  Subjective: Kristen Hines is a 41 y.o. female who presents for weight check.   Her concerns today include:  - Doing well on current Phentermine, no issues/concerns.  - Vaginal itching denies discharge has mild odor, symptoms started Friday night, tried Monistat cream no relief.   Patient Active Problem List   Diagnosis Date Noted   Other abnormal findings in specimens from other organs, systems and tissues 08/26/2021   Anemia 08/26/2021   Obesity 08/26/2021   Right ovarian cyst 05/12/2021   Pelvic pain 05/12/2021   Essential hypertension 01/11/2021   Bacterial vaginitis 12/16/2020   Prediabetes 12/14/2020   History of total abdominal hysterectomy 10/08/2020   At high risk for breast cancer 09/14/2017   Genetic testing 07/16/2017   Family history of breast cancer    Family history of pancreatic cancer    Abnormal uterine bleeding 04/16/2015     Current Outpatient Medications on File Prior to Visit  Medication Sig Dispense Refill   estradiol (VIVELLE-DOT) 0.1 MG/24HR patch 1 patch 2 (two) times a week.     hydrochlorothiazide (HYDRODIURIL) 12.5 MG tablet Take 1 tablet (12.5 mg total) by mouth daily. 120 tablet 0   ibuprofen (ADVIL) 600 MG tablet Take 1 tablet (600 mg total) by mouth every 6 (six) hours. 30 tablet 0   lisinopril (ZESTRIL) 10 MG tablet Take 1 tablet (10 mg total) by mouth daily. 120 tablet 0   Multiple Vitamin (MULTIVITAMIN ADULT PO) Take by mouth.     ondansetron (ZOFRAN) 4 MG tablet Take 1 tablet (4 mg total) by mouth every 6 (six) hours as needed for nausea. 20 tablet 0   polyethylene glycol powder (GLYCOLAX/MIRALAX) 17 GM/SCOOP powder Take 255 g by mouth daily. 255 g 3   No current facility-administered medications on file prior to visit.    No Known Allergies  Social History   Socioeconomic History   Marital status: Single    Spouse name: Not on file    Number of children: Not on file   Years of education: Not on file   Highest education level: Not on file  Occupational History   Not on file  Tobacco Use   Smoking status: Never    Passive exposure: Never   Smokeless tobacco: Never  Vaping Use   Vaping Use: Never used  Substance and Sexual Activity   Alcohol use: Yes    Comment: SOCIAL   Drug use: No   Sexual activity: Yes    Birth control/protection: Surgical  Other Topics Concern   Not on file  Social History Narrative   Not on file   Social Determinants of Health   Financial Resource Strain: Not on file  Food Insecurity: Not on file  Transportation Needs: Not on file  Physical Activity: Not on file  Stress: Not on file  Social Connections: Not on file  Intimate Partner Violence: Not on file    Family History  Problem Relation Age of Onset   Hypertension Mother    Breast cancer Mother 86       currently 55   Hypertension Father    Heart disease Father    Diabetes Father    Pancreatic cancer Maternal Aunt        diagnosed 96s; currently 10s   Prostate cancer Maternal Grandfather 91       currently 48s    Past Surgical History:  Procedure Laterality Date   ABDOMINAL HYSTERECTOMY Bilateral 04/16/2015   Procedure: HYSTERECTOMY ABDOMINAL WITH BILATERAL SALPINGECTOMY;  Surgeon: Molli Posey, MD;  Location: [place];  Service: Gynecology;  Laterality: Bilateral;   LAPAROSCOPIC VAGINAL HYSTERECTOMY WITH SALPINGECTOMY Bilateral 04/16/2015   Procedure:  ATTEMPTED LAPAROSCOPIC ASSISTED VAGINAL HYSTERECTOMY WITH BILATERAL SALPINGECTOMY;  Surgeon: Molli Posey, MD;  Location: [place];  Service: Gynecology;  Laterality: Bilateral;   LAPAROSCOPY N/A 05/12/2021   Procedure: LAPAROSCOPY DIAGNOSTIC;  Surgeon: Molli Posey, MD;  Location: [facility];  Service: Gynecology;  Laterality: N/A;   LAPAROSCOPY BILATERAL TUBAL WITH FILCHIE CLIPS/   D & C HYSTEROSCOPY  ENDOMETRIAL NOVASURE ABLATION  03-01-2009   LAPAROTOMY Bilateral 05/12/2021   Procedure: EXPLORATORY LAPAROTOMY, LYSIS OF ADHESIONS, BILATERAL salpingo-OOPHERECTOMU;  Surgeon: Molli Posey, MD;  Location: [facility];  Service: Gynecology;  Laterality: Bilateral;    ROS: Review of Systems Negative except as stated above  PHYSICAL EXAM: BP 127/87 (BP Location: Left Arm, Patient Position: Sitting, Cuff Size: Large)   Pulse 66   Temp 98.3 F (36.8 C)   Resp 16   Ht 5' 5.98" (1.676 m)   Wt 236 lb (107 kg)   LMP 04/02/2015 (Exact Date)   SpO2 98%   BMI 38.11 kg/m   Wt Readings from Last 3 Encounters:  09/24/21 236 lb (107 kg)  08/26/21 240 lb (108.9 kg)  05/12/21 244 lb 3.2 oz (110.8 kg)    Physical Exam HENT:     Head: Normocephalic and atraumatic.  Eyes:     Extraocular Movements: Extraocular movements intact.     Conjunctiva/sclera: Conjunctivae normal.     Pupils: Pupils are equal, round, and reactive to light.  Cardiovascular:     Rate and Rhythm: Normal rate and regular rhythm.     Pulses: Normal pulses.     Heart sounds: Normal heart sounds.  Pulmonary:     Effort: Pulmonary effort is normal.     Breath sounds: Normal breath sounds.  Musculoskeletal:     Cervical back: Normal range of motion and neck supple.  Neurological:     General: No focal deficit present.     Mental Status: She is alert and oriented to person, place, and time.  Psychiatric:        Mood and Affect: Mood normal.        Behavior: Behavior normal.    Results for orders placed or performed in visit on 09/24/21  POCT URINALYSIS DIP (CLINITEK)  Result Value Ref Range   Color, UA yellow yellow   Clarity, UA cloudy (A) clear   Glucose, UA negative negative mg/dL   Bilirubin, UA negative negative   Ketones, POC UA negative negative mg/dL   Spec Grav, UA 1.025 1.010 - 1.025   Blood, UA trace-intact (A) negative   pH, UA 6.0 5.0 - 8.0   POC PROTEIN,UA negative negative,  trace   Urobilinogen, UA 0.2 0.2 or 1.0 E.U./dL   Nitrite, UA Negative Negative   Leukocytes, UA Negative Negative     ASSESSMENT AND PLAN: 1. Encounter for weight management - Lost 4 pounds since last appointment.  - Increase Phentermine from 15 mg daily to 30 mg daily.  - Follow-up with primary provider in 4 weeks or sooner if needed.  - phentermine 30 MG capsule; Take 1 capsule (30 mg total) by mouth every morning.  Dispense: 30 capsule; Refill: 0  2. Vaginal itching - No urinary tract infection. - Cervicovaginal self-swab to  screen for chlamydia, gonorrhea, trichomonas, bacterial vaginitis, and candida vaginitis. - Cervicovaginal ancillary only - POCT URINALYSIS DIP (CLINITEK)    Patient was given the opportunity to ask questions.  Patient verbalized understanding of the plan and was able to repeat key elements of the plan. Patient was given clear instructions to go to Emergency Department or return to medical center if symptoms don't improve, worsen, or new problems develop.The patient verbalized understanding.   Orders Placed This Encounter  Procedures   POCT URINALYSIS DIP (CLINITEK)     Requested Prescriptions   Signed Prescriptions Disp Refills   phentermine 30 MG capsule 30 capsule 0    Sig: Take 1 capsule (30 mg total) by mouth every morning.    Return in about 4 weeks (around 10/22/2021) for Follow-Up or next available weight check.  [name], NP

## 2021-09-16 IMAGING — US US ABDOMEN LIMITED
1 series · 13 of 25 positions shown · non-contrast
Comparison: CT abdomen pelvis 06/01/2019

CLINICAL DATA: Urachal sinus

EXAM:
ULTRASOUND ABDOMEN LIMITED

[Series 1: us abdomen limited · 0.06mm/px · 28 acquisitions, 13 frames shown]
[im 1/28]
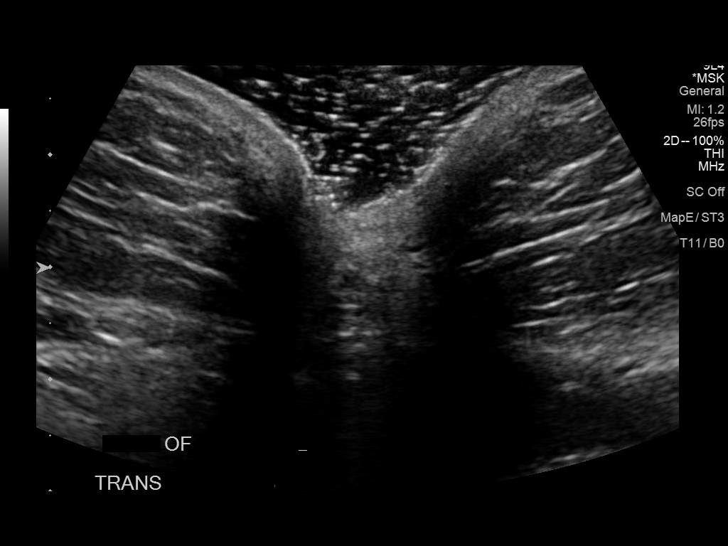
[im 3/28]
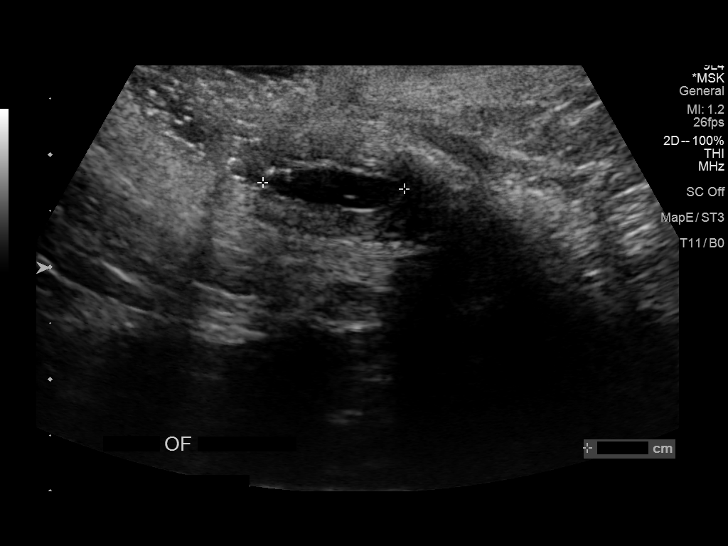
[im 5/28]
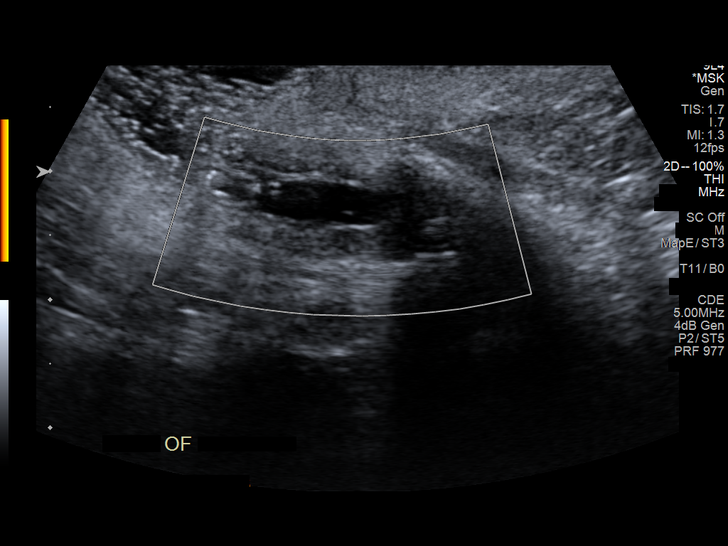
[im 7/28]
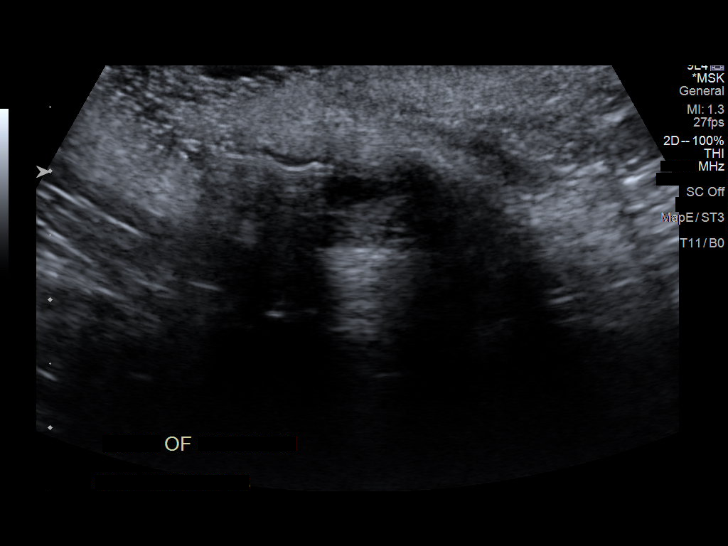
[im 10/28]
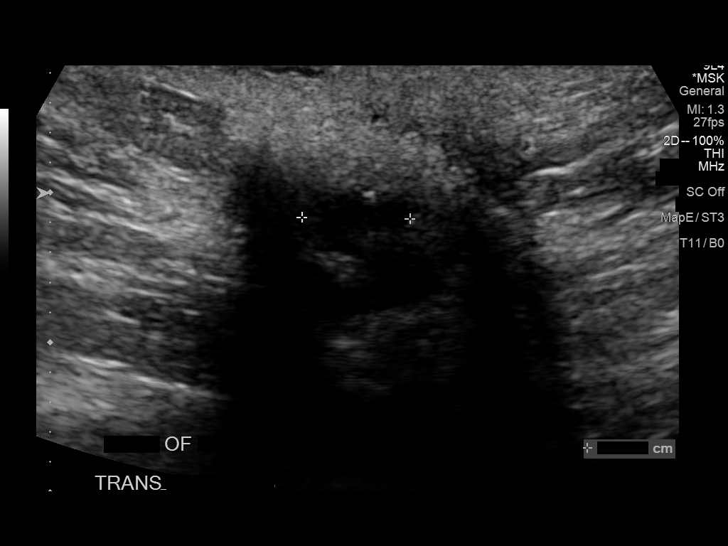
[im 12/28]
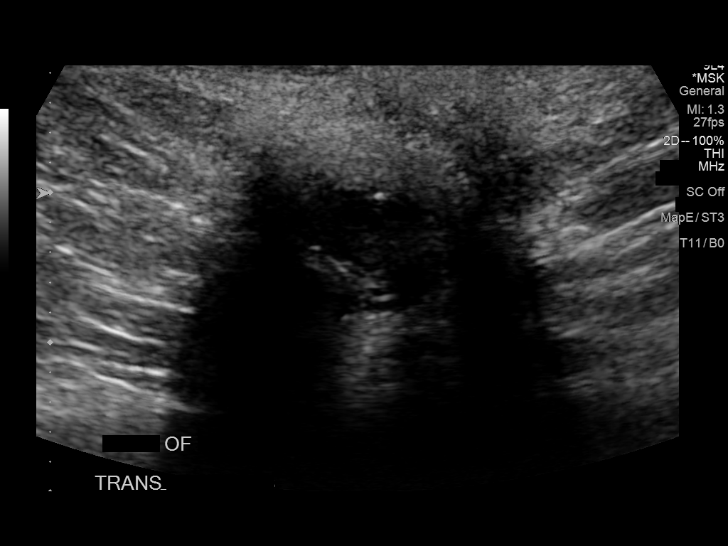
[im 14/28]
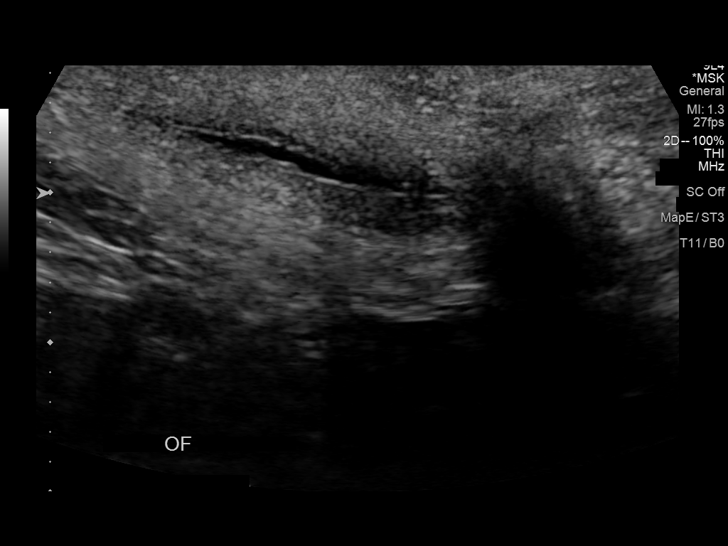
[im 16/28]
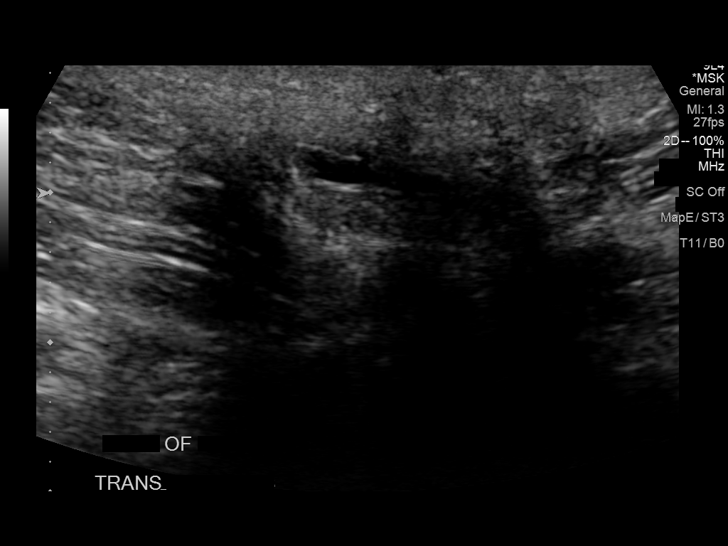
[im 19/28]
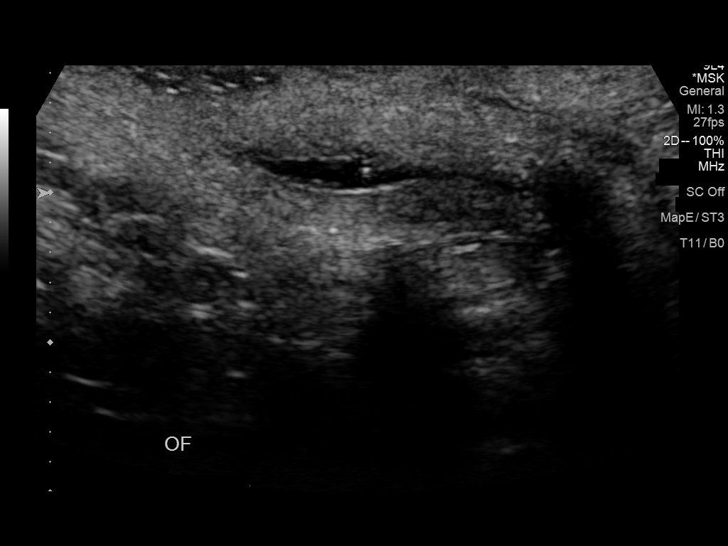
[im 21/28]
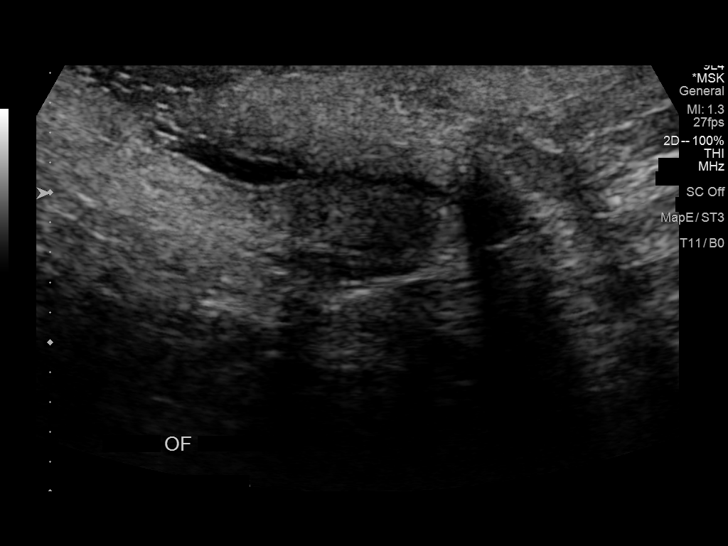
[im 23/28]
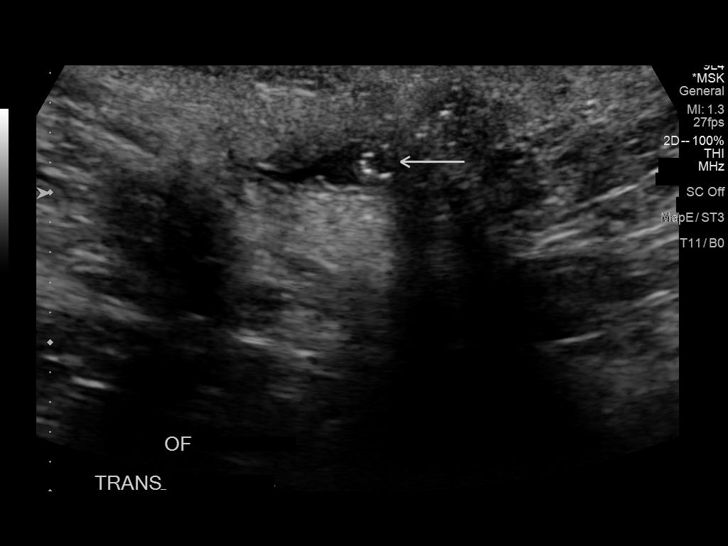
[im 25/28]
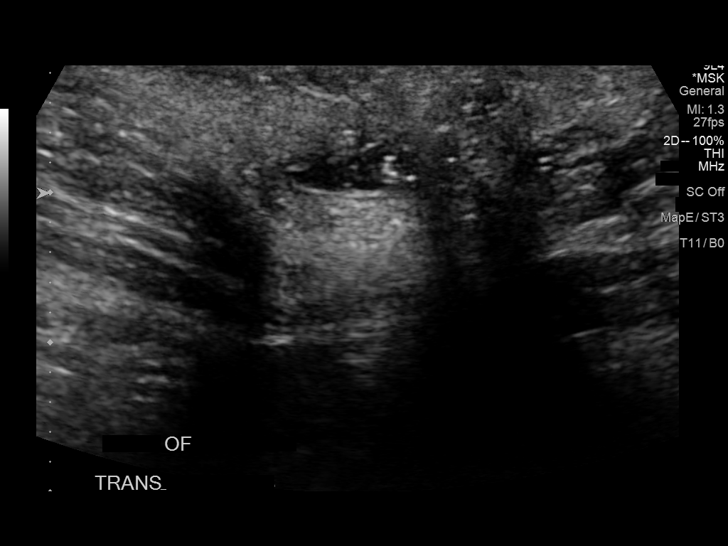
[im 28/28]
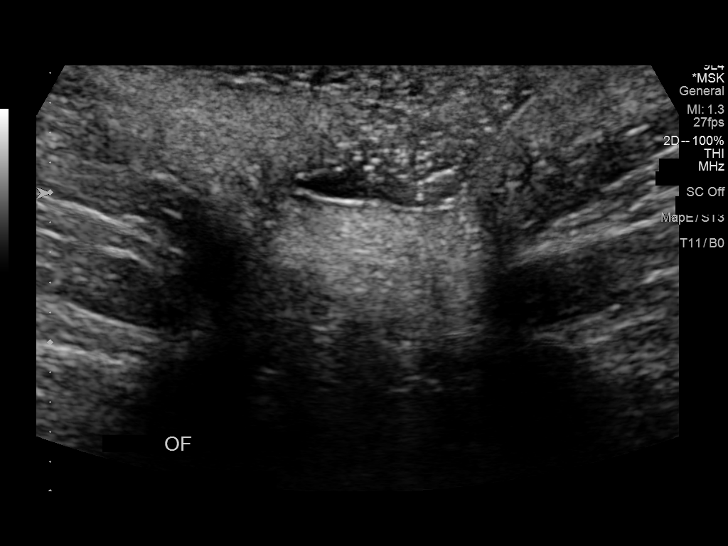

[13 of 25 positions shown; findings below may reference images not displayed]

FINDINGS: Sonography was performed at the level of the umbilicus extending
inferiorly.

At this site, a small tract of subcutaneous fluid with scattered
internal echogenic foci is identified, measuring up to 3 mm thick
and extending at least 3.2 cm length.

This courses caudally in the anterior abdominal wall subcutaneous
fat.

This corresponds to a curvilinear subcutaneous track identified in
the anterior abdominal wall infraumbilical on the prior CT, coronal
series 6, image 66.

This does not enter the abdominal cavity through the umbilicus but
courses caudally to the abdominal wall fascia approximately 3.2 cm
caudal to the umbilicus.

This may represent a small sinus tract or subcutaneous track but
suspect may not a true urachal tract/remnant as this does not enter
the abdominal cavity at the umbilicus on CT.
IMPRESSION: Question small subcutaneous track within the infraumbilical
subcutaneous fat up to 3 mm thick and coursing at least 3.2 cm
craniocaudal, containing a small amount of fluid and scattered
echogenic foci which could represent air/gas; however it is
uncertain whether this represents a generic subcutaneous sinus tract
or urachal tract since this did not enter the abdominal cavity at
the umbilicus on the prior CT.

## 2021-09-24 ENCOUNTER — Other Ambulatory Visit (HOSPITAL_COMMUNITY)
Admission: RE | Admit: 2021-09-24 | Discharge: 2021-09-24 | Disposition: A | Discharge status: Home / Self Care | Payer: BC Managed Care – PPO | Source: Ambulatory Visit | Attending: Family | Admitting: Family

## 2021-09-24 ENCOUNTER — Encounter: Payer: Self-pay | Admitting: Family

## 2021-09-24 ENCOUNTER — Ambulatory Visit (INDEPENDENT_AMBULATORY_CARE_PROVIDER_SITE_OTHER): Payer: BC Managed Care – PPO | Admitting: Family

## 2021-09-24 VITALS — BP 127/87 | HR 66 | Temp 98.3°F | Resp 16 | Ht 65.98 in | Wt 236.0 lb

## 2021-09-24 DIAGNOSIS — B3731 Acute candidiasis of vulva and vagina: Secondary | ICD-10-CM | POA: Diagnosis not present

## 2021-09-24 DIAGNOSIS — N898 Other specified noninflammatory disorders of vagina: Secondary | ICD-10-CM | POA: Insufficient documentation

## 2021-09-24 DIAGNOSIS — Z7689 Persons encountering health services in other specified circumstances: Secondary | ICD-10-CM | POA: Diagnosis not present

## 2021-09-24 LAB — POCT URINALYSIS DIP (CLINITEK)
Bilirubin, UA: NEGATIVE
Glucose, UA: NEGATIVE mg/dL
Ketones, POC UA: NEGATIVE mg/dL
Leukocytes, UA: NEGATIVE
Nitrite, UA: NEGATIVE
POC PROTEIN,UA: NEGATIVE
Spec Grav, UA: 1.025 (ref 1.010–1.025)
Urobilinogen, UA: 0.2 E.U./dL
pH, UA: 6 (ref 5.0–8.0)

## 2021-09-24 MED ORDER — PHENTERMINE HCL 30 MG PO CAPS: 30.0000 mg | ORAL_CAPSULE | ORAL | 0 refills | Status: DC

## 2021-09-24 NOTE — Progress Notes (Signed)
Pt presents for weight check, has complaints of vaginal itching denies discharge has mild odor, symptoms started Friday night pt has tried Monistat cream no relief

## 2021-09-25 ENCOUNTER — Other Ambulatory Visit: Payer: Self-pay | Admitting: Family

## 2021-09-25 DIAGNOSIS — B3731 Acute candidiasis of vulva and vagina: Secondary | ICD-10-CM

## 2021-09-25 LAB — CERVICOVAGINAL ANCILLARY ONLY
Bacterial Vaginitis (gardnerella): NEGATIVE
Candida Glabrata: NEGATIVE
Candida Vaginitis: POSITIVE — AB
Chlamydia: NEGATIVE
Comment: NEGATIVE
Comment: NEGATIVE
Comment: NEGATIVE
Comment: NEGATIVE
Comment: NEGATIVE
Comment: NORMAL
Neisseria Gonorrhea: NEGATIVE
Trichomonas: NEGATIVE

## 2021-09-25 MED ORDER — FLUCONAZOLE 150 MG PO TABS: 150.0000 mg | ORAL_TABLET | Freq: Once | ORAL | 0 refills | Status: AC

## 2021-10-14 NOTE — Progress Notes (Signed)
Erroneous encounter-disregard

## 2021-10-22 ENCOUNTER — Encounter: Payer: BC Managed Care – PPO | Admitting: Family

## 2021-10-22 DIAGNOSIS — Z7689 Persons encountering health services in other specified circumstances: Secondary | ICD-10-CM

## 2021-10-27 ENCOUNTER — Other Ambulatory Visit: Payer: 59

## 2021-11-03 NOTE — Progress Notes (Signed)
Patient ID: DERIANA Hines, female    DOB: February 15, 1981  MRN: [id number]  CC: Weight Check   Subjective: Kristen Hines is a 41 y.o. female who presents for weight check.   Her concerns today include:  Doing well on Phentermine without issues or concerns. Lost 2 pounds since last visit.   Patient Active Problem List   Diagnosis Date Noted   Candida vaginitis 09/25/2021   Other abnormal findings in specimens from other organs, systems and tissues 08/26/2021   Anemia 08/26/2021   Obesity 08/26/2021   Right ovarian cyst 05/12/2021   Pelvic pain 05/12/2021   Essential hypertension 01/11/2021   Bacterial vaginitis 12/16/2020   Prediabetes 12/14/2020   History of total abdominal hysterectomy 10/08/2020   At high risk for breast cancer 09/14/2017   Genetic testing 07/16/2017   Family history of breast cancer    Family history of pancreatic cancer    Abnormal uterine bleeding 04/16/2015     Current Outpatient Medications on File Prior to Visit  Medication Sig Dispense Refill   estradiol (VIVELLE-DOT) 0.1 MG/24HR patch 1 patch 2 (two) times a week.     hydrochlorothiazide (HYDRODIURIL) 12.5 MG tablet Take 1 tablet (12.5 mg total) by mouth daily. 120 tablet 0   ibuprofen (ADVIL) 600 MG tablet Take 1 tablet (600 mg total) by mouth every 6 (six) hours. 30 tablet 0   lisinopril (ZESTRIL) 10 MG tablet Take 1 tablet (10 mg total) by mouth daily. 120 tablet 0   Multiple Vitamin (MULTIVITAMIN ADULT PO) Take by mouth.     ondansetron (ZOFRAN) 4 MG tablet Take 1 tablet (4 mg total) by mouth every 6 (six) hours as needed for nausea. 20 tablet 0   polyethylene glycol powder (GLYCOLAX/MIRALAX) 17 GM/SCOOP powder Take 255 g by mouth daily. 255 g 3   No current facility-administered medications on file prior to visit.    No Known Allergies  Social History   Socioeconomic History   Marital status: Single    Spouse name: Not on file   Number of children: Not on file   Years of  education: Not on file   Highest education level: Not on file  Occupational History   Not on file  Tobacco Use   Smoking status: Never    Passive exposure: Never   Smokeless tobacco: Never  Vaping Use   Vaping Use: Never used  Substance and Sexual Activity   Alcohol use: Yes    Comment: SOCIAL   Drug use: No   Sexual activity: Yes    Birth control/protection: Surgical  Other Topics Concern   Not on file  Social History Narrative   Not on file   Social Determinants of Health   Financial Resource Strain: Not on file  Food Insecurity: Not on file  Transportation Needs: Not on file  Physical Activity: Not on file  Stress: Not on file  Social Connections: Not on file  Intimate Partner Violence: Not on file    Family History  Problem Relation Age of Onset   Hypertension Mother    Breast cancer Mother 22       currently 13   Hypertension Father    Heart disease Father    Diabetes Father    Pancreatic cancer Maternal Aunt        diagnosed 45s; currently 81s   Prostate cancer Maternal Grandfather 67       currently 18s    Past Surgical History:  Procedure Laterality Date   ABDOMINAL  HYSTERECTOMY Bilateral 04/16/2015   Procedure: HYSTERECTOMY ABDOMINAL WITH BILATERAL SALPINGECTOMY;  Surgeon: Molli Posey, MD;  Location: [facility];  Service: Gynecology;  Laterality: Bilateral;   LAPAROSCOPIC VAGINAL HYSTERECTOMY WITH SALPINGECTOMY Bilateral 04/16/2015   Procedure:  ATTEMPTED LAPAROSCOPIC ASSISTED VAGINAL HYSTERECTOMY WITH BILATERAL SALPINGECTOMY;  Surgeon: Molli Posey, MD;  Location: [facility];  Service: Gynecology;  Laterality: Bilateral;   LAPAROSCOPY N/A 05/12/2021   Procedure: LAPAROSCOPY DIAGNOSTIC;  Surgeon: Molli Posey, MD;  Location: [facility];  Service: Gynecology;  Laterality: N/A;   LAPAROSCOPY BILATERAL TUBAL WITH FILCHIE CLIPS/   D & C HYSTEROSCOPY ENDOMETRIAL NOVASURE ABLATION  03-01-2009    LAPAROTOMY Bilateral 05/12/2021   Procedure: EXPLORATORY LAPAROTOMY, LYSIS OF ADHESIONS, BILATERAL salpingo-OOPHERECTOMU;  Surgeon: Molli Posey, MD;  Location: [facility];  Service: Gynecology;  Laterality: Bilateral;    ROS: Review of Systems Negative except as stated above  PHYSICAL EXAM: BP 105/74 (BP Location: Left Arm, Patient Position: Sitting, Cuff Size: Large)   Pulse 71   Temp 98.3 F (36.8 C)   Resp 18   Ht 5' 5.98" (1.676 m)   Wt 234 lb (106.1 kg)   LMP 04/02/2015 (Exact Date)   SpO2 99%   BMI 37.79 kg/m   Wt Readings from Last 3 Encounters:  11/12/21 234 lb (106.1 kg)  09/24/21 236 lb (107 kg)  08/26/21 240 lb (108.9 kg)    Physical Exam HENT:     Head: Normocephalic and atraumatic.  Eyes:     Extraocular Movements: Extraocular movements intact.     Conjunctiva/sclera: Conjunctivae normal.     Pupils: Pupils are equal, round, and reactive to light.  Cardiovascular:     Rate and Rhythm: Normal rate and regular rhythm.     Pulses: Normal pulses.     Heart sounds: Normal heart sounds.  Pulmonary:     Effort: Pulmonary effort is normal.     Breath sounds: Normal breath sounds.  Musculoskeletal:     Cervical back: Normal range of motion and neck supple.  Neurological:     General: No focal deficit present.     Mental Status: She is alert and oriented to person, place, and time.  Psychiatric:        Mood and Affect: Mood normal.        Behavior: Behavior normal.    ASSESSMENT AND PLAN: 1. Encounter for weight management - Phentermine as prescribed.  - Medication holiday will begin after completion of last capsule around December 12, 2021 and will end around March 12, 2022.  - Follow-up as scheduled.  - phentermine 37.5 MG capsule; Take 1 capsule (37.5 mg total) by mouth every morning.  Dispense: 30 capsule; Refill: 0    Patient was given the opportunity to ask questions.  Patient verbalized understanding of the plan and was  able to repeat key elements of the plan. Patient was given clear instructions to go to Emergency Department or return to medical center if symptoms don't improve, worsen, or new problems develop.The patient verbalized understanding.    Requested Prescriptions   Signed Prescriptions Disp Refills   phentermine 37.5 MG capsule 30 capsule 0    Sig: Take 1 capsule (37.5 mg total) by mouth every morning.    Follow-up with primary provider as scheduled.   [name], NP

## 2021-11-10 ENCOUNTER — Ambulatory Visit
Admission: RE | Admit: 2021-11-10 | Discharge: 2021-11-10 | Disposition: A | Discharge status: Home / Self Care | Payer: BC Managed Care – PPO | Source: Ambulatory Visit | Attending: Obstetrics and Gynecology | Admitting: Obstetrics and Gynecology

## 2021-11-10 DIAGNOSIS — N6011 Diffuse cystic mastopathy of right breast: Secondary | ICD-10-CM | POA: Diagnosis not present

## 2021-11-10 DIAGNOSIS — Z803 Family history of malignant neoplasm of breast: Secondary | ICD-10-CM | POA: Diagnosis not present

## 2021-11-10 DIAGNOSIS — R898 Other abnormal findings in specimens from other organs, systems and tissues: Secondary | ICD-10-CM

## 2021-11-10 DIAGNOSIS — N6012 Diffuse cystic mastopathy of left breast: Secondary | ICD-10-CM | POA: Diagnosis not present

## 2021-11-10 MED ORDER — GADOBUTROL 1 MMOL/ML IV SOLN
10.0000 mL | Freq: Once | INTRAVENOUS | Status: AC | PRN
  Administered 2021-11-10: 10 mL via INTRAVENOUS

## 2021-11-12 ENCOUNTER — Ambulatory Visit: Payer: BC Managed Care – PPO | Admitting: Family

## 2021-11-12 ENCOUNTER — Encounter: Payer: Self-pay | Admitting: Family

## 2021-11-12 VITALS — BP 105/74 | HR 71 | Temp 98.3°F | Resp 18 | Ht 65.98 in | Wt 234.0 lb

## 2021-11-12 DIAGNOSIS — Z7689 Persons encountering health services in other specified circumstances: Secondary | ICD-10-CM | POA: Diagnosis not present

## 2021-11-12 MED ORDER — PHENTERMINE HCL 37.5 MG PO CAPS: 37.5000 mg | ORAL_CAPSULE | ORAL | 0 refills | Status: DC

## 2021-11-12 MED ORDER — PHENTERMINE HCL 37.5 MG PO CAPS: 37.5000 mg | ORAL_CAPSULE | ORAL | 0 refills | Status: AC

## 2021-11-12 NOTE — Progress Notes (Signed)
..  Pt presents for weight management follow-up  -down 2lbs from last visit

## 2021-11-12 NOTE — Patient Instructions (Signed)
Phentermine Capsules or Tablets What is this medication? PHENTERMINE (FEN ter meen) promotes weight loss. It works by decreasing appetite. It is often used for a short period of time. Changes to diet and exercise are often combined with this medication. This medicine may be used for other purposes; ask your health care provider or pharmacist if you have questions. COMMON BRAND NAME(S): Adipex-P, Atti-Plex P, Atti-Plex P Spansule, Fastin, Lomaira, Pro-Fast, Pro-Fast HS, Pro-Fast SA, Tara-8 What should I tell my care team before I take this medication? They need to know if you have any of these conditions: Agitation or nervousness Diabetes Glaucoma Heart disease High blood pressure History of drug abuse or addiction History of stroke Kidney disease Lung disease called Primary Pulmonary Hypertension (PPH) Taken an MAOI like Carbex, Eldepryl, Marplan, Nardil, or Parnate in last 14 days Taking stimulant medications for attention disorders, weight loss, or to stay awake Thyroid disease An unusual or allergic reaction to phentermine, other medications, foods, dyes, or preservatives Pregnant or trying to get pregnant Breast-feeding How should I use this medication? Take this medication by mouth with a glass of water. Follow the directions on the prescription label. Take your medication at regular intervals. Do not take it more often than directed. Do not stop taking except on your care team's advice. Talk to your care team about the use of this medication in children. While this medication may be prescribed for children 17 years or older for selected conditions, precautions do apply. Overdosage: If you think you have taken too much of this medicine contact a poison control center or emergency room at once. NOTE: This medicine is only for you. Do not share this medicine with others. What if I miss a dose? If you miss a dose, take it as soon as you can. If it is almost time for your next dose, take  only that dose. Do not take double or extra doses. What may interact with this medication? Do not take this medication with any of the following: MAOIs like Carbex, Eldepryl, Marplan, Nardil, and Parnate This medication may also interact with the following: Alcohol Certain medications for depression, anxiety, or psychotic disorders Certain medications for high blood pressure Linezolid Medications for colds or breathing difficulties like pseudoephedrine or phenylephrine Medications for diabetes Sibutramine Stimulant medications for attention disorders, weight loss, or to stay awake This list may not describe all possible interactions. Give your health care provider a list of all the medicines, herbs, non-prescription drugs, or dietary supplements you use. Also tell them if you smoke, drink alcohol, or use illegal drugs. Some items may interact with your medicine. What should I watch for while using this medication? Visit your care team for regular checks on your progress. Do not stop taking except on your care team's advice. You may develop a severe reaction. Your care team will tell you how much medication to take. Do not take this medication close to bedtime. It may prevent you from sleeping. You may get drowsy or dizzy. Do not drive, use machinery, or do anything that needs mental alertness until you know how this medication affects you. Do not stand or sit up quickly, especially if you are an older patient. This reduces the risk of dizzy or fainting spells. Alcohol may increase dizziness and drowsiness. Avoid alcoholic drinks. This medication may affect blood sugar levels. Ask your care team if changes in diet or medications are needed if you have diabetes. Women should inform their care team if they wish to become  pregnant or think they might be pregnant. Losing weight while pregnant is not advised and may cause harm to the unborn child. Talk to your care team for more information. What side  effects may I notice from receiving this medication? Side effects that you should report to your care team as soon as possible: Allergic reactions--skin rash, itching, hives, swelling of the face, lips, tongue, or throat Heart valve disease--shortness of breath, chest pain, unusual weakness or fatigue, dizziness, feeling faint or lightheaded, fever, sudden weight gain, fast or irregular heartbeat Pulmonary hypertension--shortness of breath, chest pain, fast or irregular heartbeat, feeling faint or lightheaded, fatigue, swelling of the ankles or feet Side effects that usually do not require medical attention (report to your care team if they continue or are bothersome): Change in taste Diarrhea Dizziness Dry mouth Restlessness Trouble sleeping This list may not describe all possible side effects. Call your doctor for medical advice about side effects. You may report side effects to FDA at 1-800-FDA-1088. Where should I keep my medication? Keep out of the reach of children. This medication can be abused. Keep your medication in a safe place to protect it from theft. Do not share this medication with anyone. Selling or giving away this medication is dangerous and against the law. This medication may cause harm and death if it is taken by other adults, children, or pets. Return medication that has not been used to an official disposal site. Contact the DEA at 318-847-3390 or your city/county government to find a site. If you cannot return the medication, mix any unused medication with a substance like cat litter or coffee grounds. Then throw the medication away in a sealed container like a sealed bag or coffee can with a lid. Do not use the medication after the expiration date. Store at room temperature between 20 and 25 degrees C (68 and 77 degrees F). Keep container tightly closed. NOTE: This sheet is a summary. It may not cover all possible information. If you have questions about this medicine,  talk to your doctor, pharmacist, or health care provider.  2023 Elsevier/Gold Standard (2021-02-07 00:00:00)

## 2021-12-17 NOTE — Progress Notes (Signed)
Patient ID: Kristen Hines, female    DOB: [birth year]  MRN: [id number]  CC: Chronic Care Management   Subjective: Kristen Hines is a 41 y.o. female who presents for chronic care management.   Her concerns today include:  Doing well on blood pressure medications. Denies red flag symptoms. Doing well with weight maintenance. Would like to know if a no carb/low carb diet is recommended. She has recently trial limiting carb intake. I discussed with patient it is good to limit carb intake and choose healthier carbs such as sweet potatoes and brown rice. This also helps with weight loss/maintenance.  Discussed long term it may be challenging to have a diet completely free of carbs and may gain weight once carbs reintroduced in diet. She has seen a nutritionist in the past and not ready for referral back as of present. Vaginal itching x 2 days, no additional symptoms.  Patient Active Problem List   Diagnosis Date Noted   Candida vaginitis 09/25/2021   Other abnormal findings in specimens from other organs, systems and tissues 08/26/2021   Anemia 08/26/2021   Obesity 08/26/2021   Right ovarian cyst 05/12/2021   Pelvic pain 05/12/2021   Essential hypertension 01/11/2021   Bacterial vaginitis 12/16/2020   Prediabetes 12/14/2020   History of total abdominal hysterectomy 10/08/2020   At high risk for breast cancer 09/14/2017   Genetic testing 07/16/2017   Family history of breast cancer    Family history of pancreatic cancer    Abnormal uterine bleeding 04/16/2015     Current Outpatient Medications on File Prior to Visit  Medication Sig Dispense Refill   estradiol (VIVELLE-DOT) 0.1 MG/24HR patch 1 patch 2 (two) times a week.     ibuprofen (ADVIL) 600 MG tablet Take 1 tablet (600 mg total) by mouth every 6 (six) hours. 30 tablet 0   Multiple Vitamin (MULTIVITAMIN ADULT PO) Take by mouth.     ondansetron (ZOFRAN) 4 MG tablet Take 1 tablet (4 mg total) by mouth every 6 (six) hours as  needed for nausea. 20 tablet 0   phentermine 37.5 MG capsule Take 1 capsule (37.5 mg total) by mouth every morning. 30 capsule 0   polyethylene glycol powder (GLYCOLAX/MIRALAX) 17 GM/SCOOP powder Take 255 g by mouth daily. 255 g 3   No current facility-administered medications on file prior to visit.    No Known Allergies  Social History   Socioeconomic History   Marital status: Single    Spouse name: Not on file   Number of children: Not on file   Years of education: Not on file   Highest education level: Not on file  Occupational History   Not on file  Tobacco Use   Smoking status: Never    Passive exposure: Never   Smokeless tobacco: Never  Vaping Use   Vaping Use: Never used  Substance and Sexual Activity   Alcohol use: Yes    Comment: SOCIAL   Drug use: No   Sexual activity: Yes    Birth control/protection: Surgical  Other Topics Concern   Not on file  Social History Narrative   Not on file   Social Determinants of Health   Financial Resource Strain: Not on file  Food Insecurity: Not on file  Transportation Needs: Not on file  Physical Activity: Not on file  Stress: Not on file  Social Connections: Not on file  Intimate Partner Violence: Not on file    Family History  Problem Relation Age of Onset  Hypertension Mother    Breast cancer Mother 40       currently 20   Hypertension Father    Heart disease Father    Diabetes Father    Pancreatic cancer Maternal Aunt        diagnosed 63s; currently 77s   Prostate cancer Maternal Grandfather 53       currently 66s    Past Surgical History:  Procedure Laterality Date   ABDOMINAL HYSTERECTOMY Bilateral 04/16/2015   Procedure: HYSTERECTOMY ABDOMINAL WITH BILATERAL SALPINGECTOMY;  Surgeon: Molli Posey, MD;  Location: [place];  Service: Gynecology;  Laterality: Bilateral;   LAPAROSCOPIC VAGINAL HYSTERECTOMY WITH SALPINGECTOMY Bilateral 04/16/2015   Procedure:  ATTEMPTED LAPAROSCOPIC  ASSISTED VAGINAL HYSTERECTOMY WITH BILATERAL SALPINGECTOMY;  Surgeon: Molli Posey, MD;  Location: [place];  Service: Gynecology;  Laterality: Bilateral;   LAPAROSCOPY N/A 05/12/2021   Procedure: LAPAROSCOPY DIAGNOSTIC;  Surgeon: Molli Posey, MD;  Location: [facility];  Service: Gynecology;  Laterality: N/A;   LAPAROSCOPY BILATERAL TUBAL WITH FILCHIE CLIPS/   D & C HYSTEROSCOPY ENDOMETRIAL NOVASURE ABLATION  03-01-2009   LAPAROTOMY Bilateral 05/12/2021   Procedure: EXPLORATORY LAPAROTOMY, LYSIS OF ADHESIONS, BILATERAL salpingo-OOPHERECTOMU;  Surgeon: Molli Posey, MD;  Location: [facility];  Service: Gynecology;  Laterality: Bilateral;    ROS: Review of Systems Negative except as stated above  PHYSICAL EXAM: BP 125/84 (BP Location: Left Arm, Patient Position: Sitting, Cuff Size: Large)   Pulse 79   Temp 98.3 F (36.8 C)   Resp 16   Ht 5' 5.98" (1.676 m)   Wt 228 lb (103.4 kg)   LMP 04/02/2015 (Exact Date)   SpO2 98%   BMI 36.82 kg/m    Wt Readings from Last 3 Encounters:  12/26/21 228 lb (103.4 kg)  11/12/21 234 lb (106.1 kg)  09/24/21 236 lb (107 kg)   Physical Exam HENT:     Head: Normocephalic and atraumatic.  Eyes:     Extraocular Movements: Extraocular movements intact.     Conjunctiva/sclera: Conjunctivae normal.     Pupils: Pupils are equal, round, and reactive to light.  Cardiovascular:     Rate and Rhythm: Normal rate and regular rhythm.     Pulses: Normal pulses.     Heart sounds: Normal heart sounds.  Pulmonary:     Effort: Pulmonary effort is normal.     Breath sounds: Normal breath sounds.  Musculoskeletal:     Cervical back: Normal range of motion and neck supple.  Neurological:     General: No focal deficit present.     Mental Status: She is alert and oriented to person, place, and time.  Psychiatric:        Mood and Affect: Mood normal.        Behavior: Behavior normal.    Results for  orders placed or performed in visit on 12/26/21  POCT URINALYSIS DIP (CLINITEK)  Result Value Ref Range   Color, UA yellow yellow   Clarity, UA clear clear   Glucose, UA negative negative mg/dL   Bilirubin, UA negative negative   Ketones, POC UA negative negative mg/dL   Spec Grav, UA 1.025 1.010 - 1.025   Blood, UA trace-lysed (A) negative   pH, UA 6.0 5.0 - 8.0   POC PROTEIN,UA negative negative, trace   Urobilinogen, UA 0.2 0.2 or 1.0 E.U./dL   Nitrite, UA Negative Negative   Leukocytes, UA Negative Negative  POCT glycosylated hemoglobin (Hb A1C)  Result Value Ref  Range   Hemoglobin A1C 5.6 4.0 - 5.6 %   HbA1c POC (<> result, manual entry)     HbA1c, POC (prediabetic range)     HbA1c, POC (controlled diabetic range)        ASSESSMENT AND PLAN: 1. Primary hypertension - Continue Lisinopril and Hydrochlorothiazide as prescribed.  - Counseled on blood pressure goal of less than 130/80, low-sodium, DASH diet, medication compliance, and 150 minutes of moderate intensity exercise per week as tolerated. Counseled on medication adherence and adverse effects. - Follow-up with primary provider in 3 months or sooner if needed.  - lisinopril (ZESTRIL) 10 MG tablet; Take 1 tablet (10 mg total) by mouth daily.  Dispense: 30 tablet; Refill: 2 - hydrochlorothiazide (HYDRODIURIL) 12.5 MG tablet; Take 1 tablet (12.5 mg total) by mouth daily.  Dispense: 30 tablet; Refill: 2  2. Prediabetes - Hemoglobin A1c 5.6%. No longer in prediabetic range.  - Recheck in 6 months.  - POCT glycosylated hemoglobin (Hb A1C)  3. Vaginal itching - No urinary tract infection.  - Routine screening. - Cervicovaginal ancillary only - POCT URINALYSIS DIP (CLINITEK)  4. Encounter for weight management - Patient doing well with weight maintenance. Declines nutrition referral as of present. Phentermine holiday ends around 03/12/2022 and can discuss possibly resuming at that time. Follow-up with me as needed.      Patient was given the opportunity to ask questions.  Patient verbalized understanding of the plan and was able to repeat key elements of the plan. Patient was given clear instructions to go to Emergency Department or return to medical center if symptoms don't improve, worsen, or new problems develop.The patient verbalized understanding.   Orders Placed This Encounter  Procedures   POCT URINALYSIS DIP (CLINITEK)   POCT glycosylated hemoglobin (Hb A1C)     Requested Prescriptions   Signed Prescriptions Disp Refills   lisinopril (ZESTRIL) 10 MG tablet 30 tablet 2    Sig: Take 1 tablet (10 mg total) by mouth daily.   hydrochlorothiazide (HYDRODIURIL) 12.5 MG tablet 30 tablet 2    Sig: Take 1 tablet (12.5 mg total) by mouth daily.    Return in about 3 months (around 03/28/2022) for Follow-Up or next available chronic care mgmt.  [name], NP

## 2021-12-26 ENCOUNTER — Other Ambulatory Visit (HOSPITAL_COMMUNITY)
Admission: RE | Admit: 2021-12-26 | Discharge: 2021-12-26 | Disposition: A | Discharge status: Home / Self Care | Payer: BC Managed Care – PPO | Source: Ambulatory Visit | Attending: Family | Admitting: Family

## 2021-12-26 ENCOUNTER — Ambulatory Visit: Payer: BC Managed Care – PPO | Admitting: Family

## 2021-12-26 VITALS — BP 125/84 | HR 79 | Temp 98.3°F | Resp 16 | Ht 65.98 in | Wt 228.0 lb

## 2021-12-26 DIAGNOSIS — R7303 Prediabetes: Secondary | ICD-10-CM | POA: Diagnosis not present

## 2021-12-26 DIAGNOSIS — Z7689 Persons encountering health services in other specified circumstances: Secondary | ICD-10-CM | POA: Diagnosis not present

## 2021-12-26 DIAGNOSIS — I10 Essential (primary) hypertension: Secondary | ICD-10-CM

## 2021-12-26 DIAGNOSIS — N898 Other specified noninflammatory disorders of vagina: Secondary | ICD-10-CM | POA: Diagnosis not present

## 2021-12-26 LAB — POCT GLYCOSYLATED HEMOGLOBIN (HGB A1C): Hemoglobin A1C: 5.6 % (ref 4.0–5.6)

## 2021-12-26 LAB — POCT URINALYSIS DIP (CLINITEK)
Bilirubin, UA: NEGATIVE
Glucose, UA: NEGATIVE mg/dL
Ketones, POC UA: NEGATIVE mg/dL
Leukocytes, UA: NEGATIVE
Nitrite, UA: NEGATIVE
POC PROTEIN,UA: NEGATIVE
Spec Grav, UA: 1.025 (ref 1.010–1.025)
Urobilinogen, UA: 0.2 E.U./dL
pH, UA: 6 (ref 5.0–8.0)

## 2021-12-26 MED ORDER — HYDROCHLOROTHIAZIDE 12.5 MG PO TABS: 12.5000 mg | ORAL_TABLET | Freq: Every day | ORAL | 2 refills | Status: AC

## 2021-12-26 MED ORDER — LISINOPRIL 10 MG PO TABS: 10.0000 mg | ORAL_TABLET | Freq: Every day | ORAL | 2 refills | Status: AC

## 2021-12-26 NOTE — Progress Notes (Signed)
.  Pt presents for chronic care management   -pt experiencing vaginal itching for approx 2 days  -denies any odor or discharge

## 2021-12-29 ENCOUNTER — Other Ambulatory Visit: Payer: Self-pay | Admitting: Family

## 2021-12-29 DIAGNOSIS — B3731 Acute candidiasis of vulva and vagina: Secondary | ICD-10-CM

## 2021-12-29 LAB — CERVICOVAGINAL ANCILLARY ONLY
Bacterial Vaginitis (gardnerella): NEGATIVE
Candida Glabrata: NEGATIVE
Candida Vaginitis: POSITIVE — AB
Chlamydia: NEGATIVE
Comment: NEGATIVE
Comment: NEGATIVE
Comment: NEGATIVE
Comment: NEGATIVE
Comment: NEGATIVE
Comment: NORMAL
Neisseria Gonorrhea: NEGATIVE
Trichomonas: NEGATIVE

## 2021-12-29 MED ORDER — FLUCONAZOLE 150 MG PO TABS: 150.0000 mg | ORAL_TABLET | Freq: Once | ORAL | 0 refills | Status: AC

## 2022-05-21 DIAGNOSIS — Z113 Encounter for screening for infections with a predominantly sexual mode of transmission: Secondary | ICD-10-CM | POA: Diagnosis not present

## 2022-05-21 DIAGNOSIS — Z01419 Encounter for gynecological examination (general) (routine) without abnormal findings: Secondary | ICD-10-CM | POA: Diagnosis not present

## 2022-05-21 DIAGNOSIS — Z6839 Body mass index (BMI) 39.0-39.9, adult: Secondary | ICD-10-CM | POA: Diagnosis not present

## 2022-05-21 DIAGNOSIS — N760 Acute vaginitis: Secondary | ICD-10-CM | POA: Diagnosis not present

## 2022-08-20 DIAGNOSIS — Z1231 Encounter for screening mammogram for malignant neoplasm of breast: Secondary | ICD-10-CM | POA: Diagnosis not present

## 2022-08-25 ENCOUNTER — Other Ambulatory Visit: Payer: Self-pay | Admitting: Obstetrics and Gynecology

## 2022-08-25 DIAGNOSIS — R928 Other abnormal and inconclusive findings on diagnostic imaging of breast: Secondary | ICD-10-CM

## 2022-08-28 DIAGNOSIS — M7661 Achilles tendinitis, right leg: Secondary | ICD-10-CM | POA: Diagnosis not present

## 2022-09-08 ENCOUNTER — Ambulatory Visit
Admission: RE | Admit: 2022-09-08 | Discharge: 2022-09-08 | Disposition: A | Discharge status: Home / Self Care | Payer: BC Managed Care – PPO | Source: Ambulatory Visit | Attending: Obstetrics and Gynecology | Admitting: Obstetrics and Gynecology

## 2022-09-08 DIAGNOSIS — R928 Other abnormal and inconclusive findings on diagnostic imaging of breast: Secondary | ICD-10-CM

## 2022-09-08 DIAGNOSIS — N6002 Solitary cyst of left breast: Secondary | ICD-10-CM | POA: Diagnosis not present

## 2022-09-18 DIAGNOSIS — M7661 Achilles tendinitis, right leg: Secondary | ICD-10-CM | POA: Diagnosis not present

## 2022-10-21 DIAGNOSIS — M7661 Achilles tendinitis, right leg: Secondary | ICD-10-CM | POA: Diagnosis not present

## 2022-10-26 DIAGNOSIS — N760 Acute vaginitis: Secondary | ICD-10-CM | POA: Diagnosis not present

## 2022-10-26 DIAGNOSIS — Z113 Encounter for screening for infections with a predominantly sexual mode of transmission: Secondary | ICD-10-CM | POA: Diagnosis not present

## 2023-02-16 ENCOUNTER — Ambulatory Visit
Admission: EM | Admit: 2023-02-16 | Discharge: 2023-02-16 | Disposition: A | Discharge status: Home / Self Care | Payer: 59 | Attending: Physician Assistant | Admitting: Physician Assistant

## 2023-02-16 DIAGNOSIS — N898 Other specified noninflammatory disorders of vagina: Secondary | ICD-10-CM | POA: Diagnosis present

## 2023-02-16 MED ORDER — METRONIDAZOLE 500 MG PO TABS: 500.0000 mg | ORAL_TABLET | Freq: Two times a day (BID) | ORAL | 0 refills | Status: AC

## 2023-02-16 NOTE — ED Provider Notes (Signed)
[facility]    CSN: [id number] Arrival date & time: 02/16/23  1516      History   Chief Complaint No chief complaint on file.   HPI Kristen Hines is a 42 y.o. female.   Patient presents today with a 2-day history of recurrent vaginal discharge with associated odor.  She does have a history of recurrent yeast infections and so had the symptoms several weeks ago.  She used over-the-counter Azo/Monistat and her symptoms improved until recurrence a few days ago.  She wanted to be evaluated know that she has developed the odor.  Denies any changes to personal hygiene products including soaps or detergents.  She does have a history of recurrent vaginal issues.  She is sexually active and has no specific concern for STI but is open to testing.  She is no concern for pregnancy as she is status post hysterectomy.  Denies any pelvic pain, abdominal pain, fever, nausea, vomiting, urinary symptoms.    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   Heart murmur    per pt mild asymptomatic   Hypertension    Leiomyoma of uterus     Patient Active Problem List   Diagnosis Date Noted   Candida vaginitis 09/25/2021   Other abnormal findings in specimens from other organs, systems and tissues 08/26/2021   Anemia 08/26/2021   Obesity 08/26/2021   Right ovarian cyst 05/12/2021   Pelvic pain 05/12/2021   Essential hypertension 01/11/2021   Bacterial vaginitis 12/16/2020   Prediabetes 12/14/2020   History of total abdominal hysterectomy 10/08/2020   At high risk for breast cancer 09/14/2017   Genetic testing 07/16/2017   Family history of breast cancer    Family history of pancreatic cancer    Abnormal uterine bleeding 04/16/2015    Past Surgical History:   Procedure Laterality Date   ABDOMINAL HYSTERECTOMY Bilateral 04/16/2015   Procedure: HYSTERECTOMY ABDOMINAL WITH BILATERAL SALPINGECTOMY;  Surgeon: Richarda Overlie, MD;  Location: [facility];  Service: Gynecology;  Laterality: Bilateral;   LAPAROSCOPIC VAGINAL HYSTERECTOMY WITH SALPINGECTOMY Bilateral 04/16/2015   Procedure:  ATTEMPTED LAPAROSCOPIC ASSISTED VAGINAL HYSTERECTOMY WITH BILATERAL SALPINGECTOMY;  Surgeon: Richarda Overlie, MD;  Location: [facility];  Service: Gynecology;  Laterality: Bilateral;   LAPAROSCOPY N/A 05/12/2021   Procedure: LAPAROSCOPY DIAGNOSTIC;  Surgeon: Richarda Overlie, MD;  Location: [facility];  Service: Gynecology;  Laterality: N/A;   LAPAROSCOPY BILATERAL TUBAL WITH FILCHIE CLIPS/   D & C HYSTEROSCOPY ENDOMETRIAL NOVASURE ABLATION  03-01-2009   LAPAROTOMY Bilateral 05/12/2021   Procedure: EXPLORATORY LAPAROTOMY, LYSIS OF ADHESIONS, BILATERAL salpingo-OOPHERECTOMU;  Surgeon: Richarda Overlie, MD;  Location: [facility];  Service: Gynecology;  Laterality: Bilateral;    OB History   No obstetric history on file.      Home Medications    Prior to Admission medications   Medication Sig Start Date End Date Taking? Authorizing Provider  metroNIDAZOLE (FLAGYL) 500 MG tablet Take 1 tablet (500 mg total) by mouth 2 (two) times daily. 02/16/23  Yes Demetrious Rainford K, PA-C  estradiol (VIVELLE-DOT) 0.1 MG/24HR patch 1 patch 2 (two) times a week. 08/11/21   [provider]  hydrochlorothiazide (HYDRODIURIL) 12.5 MG tablet Take 1 tablet (  12.5 mg total) by mouth daily. 12/26/21 03/26/22  Rema Fendt, NP  ibuprofen (ADVIL) 600 MG tablet Take 1 tablet (600 mg total) by mouth every 6 (six) hours. 05/13/21   Richarda Overlie, MD  lisinopril (ZESTRIL) 10 MG tablet Take 1 tablet (10 mg total) by mouth daily. 12/26/21 03/26/22  Rema Fendt, NP  Multiple Vitamin (MULTIVITAMIN ADULT PO) Take by mouth.    [provider]  ondansetron (ZOFRAN) 4 MG tablet Take 1 tablet (4 mg total) by mouth every 6 (six) hours as needed for nausea. 05/13/21   Richarda Overlie, MD  phentermine 37.5 MG capsule Take 1 capsule (37.5 mg total) by mouth every morning. 11/12/21 12/12/21  Rema Fendt, NP  polyethylene glycol powder (GLYCOLAX/MIRALAX) 17 GM/SCOOP powder Take 255 g by mouth daily. 01/01/21   Jenel Lucks, MD    Family History Family History  Problem Relation Age of Onset   Hypertension Mother    Breast cancer Mother 74       currently 28   Hypertension Father    Heart disease Father    Diabetes Father    Pancreatic cancer Maternal Aunt        diagnosed 23s; currently 40s   Prostate cancer Maternal Grandfather 46       currently 37s    Social History Social History   Tobacco Use   Smoking status: Never    Passive exposure: Never   Smokeless tobacco: Never  Vaping Use   Vaping status: Never Used  Substance Use Topics   Alcohol use: Yes    Comment: SOCIAL   Drug use: No     Allergies   Patient has no known allergies.   Review of Systems Review of Systems  Constitutional:  Negative for activity change, appetite change, fatigue and fever.  Gastrointestinal:  Negative for abdominal pain, diarrhea, nausea and vomiting.  Genitourinary:  Positive for vaginal discharge. Negative for dysuria, frequency, pelvic pain, urgency, vaginal bleeding and vaginal pain.     Physical Exam Triage Vital Signs ED Triage Vitals  Encounter Vitals Group     BP 02/16/23 1644 (!) 145/90     Systolic BP Percentile --      Diastolic BP Percentile --      Pulse Rate 02/16/23 1644 65     Resp 02/16/23 1644 16     Temp 02/16/23 1644 98.2 F (36.8 C)     Temp Source 02/16/23 1644 Oral     SpO2 02/16/23 1644 100 %     Weight 02/16/23 1646 245 lb (111.1 kg)     Height 02/16/23 1646 5\' 6"  (1.676 m)     Head Circumference --      Peak Flow --      Pain Score 02/16/23 1642 0     Pain Loc --       Pain Education --      Exclude from Growth Chart --    No data found.  Updated Vital Signs BP (!) 145/90 (BP Location: Left Arm)   Pulse 65   Temp 98.2 F (36.8 C) (Oral)   Resp 16   Ht 5\' 6"  (1.676 m)   Wt 245 lb (111.1 kg)   LMP 04/02/2015 (Exact Date)   SpO2 100%   BMI 39.54 kg/m   Visual Acuity Right Eye Distance:   Left Eye Distance:   Bilateral Distance:    Right Eye Near:   Left Eye Near:    Bilateral Near:  Physical Exam Vitals reviewed.  Constitutional:      General: She is awake. She is not in acute distress.    Appearance: Normal appearance. She is well-developed. She is not ill-appearing.     Comments: Very pleasant female appears stated age in no acute distress sitting comfortably in exam room  HENT:     Head: Normocephalic and atraumatic.  Cardiovascular:     Rate and Rhythm: Normal rate and regular rhythm.     Heart sounds: Normal heart sounds, S1 normal and S2 normal. No murmur heard. Pulmonary:     Effort: Pulmonary effort is normal.     Breath sounds: Normal breath sounds. No wheezing, rhonchi or rales.     Comments: Clear to auscultation bilaterally Abdominal:     General: Bowel sounds are normal.     Palpations: Abdomen is soft.     Tenderness: There is no abdominal tenderness. There is no right CVA tenderness, left CVA tenderness, guarding or rebound.     Comments: Benign abdominal exam  Genitourinary:    Comments: Exam deferred Psychiatric:        Behavior: Behavior is cooperative.      UC Treatments / Results  Labs (all labs ordered are listed, but only abnormal results are displayed) Labs Reviewed  CERVICOVAGINAL ANCILLARY ONLY    EKG   Radiology No results found.  Procedures Procedures (including critical care time)  Medications Ordered in UC Medications - No data to display  Initial Impression / Assessment and Plan / UC Course  I have reviewed the triage vital signs and the nursing notes.  Pertinent labs &  imaging results that were available during my care of the patient were reviewed by me and considered in my medical decision making (see chart for details).     Patient is well-appearing, afebrile, nontoxic, nontachycardic.  Vital signs and physical exam are reassuring with no indication for emergent evaluation or imaging.  Will empirically treat for bacterial vaginosis given her clinical presentation.  Metronidazole 500 mg twice daily for 7 days with sent to pharmacy and we discussed that she should avoid alcohol while on this medication for 3 days after completing course due to associated Antabuse side effects.  She was encouraged to wear loosefitting cotton underwear and use hypoallergenic soaps and detergents.  We will contact her if we need to arrange any additional treatment based on her swab result.  Discussed that if she has any worsening symptoms including pelvic pain, abdominal pain, fever, nausea, vomiting she needs to be seen emergently.  Strict return precautions given.    Final Clinical Impressions(s) / UC Diagnoses   Final diagnoses:  Vaginal discharge  Vaginal odor     Discharge Instructions      We are treating you for bacterial vaginosis.  Start metronidazole twice daily for 7 days.  Do not drink any alcohol while on this medication and for 3 days after finishing the course as it will cause you to vomit.  Wear loosefitting cotton underwear and use hypotonic soaps and detergents.  We will contact you if need to arrange additional treatment based on your swab.  If you develop any pelvic pain, abdominal pain, fever, nausea, vomiting you should be seen immediately.     ED Prescriptions     Medication Sig Dispense Auth. Provider   metroNIDAZOLE (FLAGYL) 500 MG tablet Take 1 tablet (500 mg total) by mouth 2 (two) times daily. 14 tablet Ciel Yanes, Noberto Retort, PA-C      PDMP not  reviewed this encounter.   Jeani Hawking, PA-C 02/16/23 1702

## 2023-02-16 NOTE — ED Triage Notes (Signed)
Patient presents with vaginal discharge and odor x day 2. Patient states she was treated with Azo and discharge went away and came back.

## 2023-02-16 NOTE — Discharge Instructions (Signed)
We are treating you for bacterial vaginosis.  Start metronidazole twice daily for 7 days.  Do not drink any alcohol while on this medication and for 3 days after finishing the course as it will cause you to vomit.  Wear loosefitting cotton underwear and use hypotonic soaps and detergents.  We will contact you if need to arrange additional treatment based on your swab.  If you develop any pelvic pain, abdominal pain, fever, nausea, vomiting you should be seen immediately.

## 2023-02-17 LAB — CERVICOVAGINAL ANCILLARY ONLY
Bacterial Vaginitis (gardnerella): POSITIVE — AB
Candida Glabrata: NEGATIVE
Candida Vaginitis: NEGATIVE
Chlamydia: NEGATIVE
Comment: NEGATIVE
Comment: NEGATIVE
Comment: NEGATIVE
Comment: NEGATIVE
Comment: NEGATIVE
Comment: NORMAL
Neisseria Gonorrhea: NEGATIVE
Trichomonas: NEGATIVE

## 2023-03-20 ENCOUNTER — Ambulatory Visit
Admission: EM | Admit: 2023-03-20 | Discharge: 2023-03-20 | Disposition: A | Discharge status: Home / Self Care | Payer: 59 | Attending: Internal Medicine | Admitting: Internal Medicine

## 2023-03-20 DIAGNOSIS — N760 Acute vaginitis: Secondary | ICD-10-CM | POA: Insufficient documentation

## 2023-03-20 MED ORDER — FLUCONAZOLE 150 MG PO TABS
150.0000 mg | ORAL_TABLET | Freq: Every day | ORAL | 0 refills | Status: AC
Start: 2023-03-20 — End: 2023-03-22

## 2023-03-20 NOTE — ED Provider Notes (Signed)
[facility]    CSN: [id number] Arrival date & time: 03/20/23  1300      History   Chief Complaint No chief complaint on file.   HPI Kristen Hines is a 42 y.o. female presents for vaginal discharge.  Patient reports 4 days of a thick pruritic vaginal discharge with some lower abdominal cramping.  Denies any dysuria, fevers, nausea/vomiting, flank pain.  No STD concern but would like screening while here.  Reports history of reoccurring yeast infections and states this feels consistent with that.  Has a history of hysterectomy.  No OTC medications have been used since onset.  No other concerns at this time.  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   Heart murmur    per pt mild asymptomatic   Hypertension    Leiomyoma of uterus     Patient Active Problem List   Diagnosis Date Noted   Candida vaginitis 09/25/2021   Other abnormal findings in specimens from other organs, systems and tissues 08/26/2021   Anemia 08/26/2021   Obesity 08/26/2021   Right ovarian cyst 05/12/2021   Pelvic pain 05/12/2021   Essential hypertension 01/11/2021   Bacterial vaginitis 12/16/2020   Prediabetes 12/14/2020   History of total abdominal hysterectomy 10/08/2020   At high risk for breast cancer 09/14/2017   Genetic testing 07/16/2017   Family history of breast cancer    Family history of pancreatic cancer    Abnormal uterine bleeding 04/16/2015    Past Surgical History:  Procedure Laterality Date   ABDOMINAL HYSTERECTOMY Bilateral 04/16/2015   Procedure: HYSTERECTOMY ABDOMINAL WITH BILATERAL SALPINGECTOMY;  Surgeon: Richarda Overlie, MD;  Location: [facility];  Service: Gynecology;  Laterality: Bilateral;   LAPAROSCOPIC VAGINAL HYSTERECTOMY  WITH SALPINGECTOMY Bilateral 04/16/2015   Procedure:  ATTEMPTED LAPAROSCOPIC ASSISTED VAGINAL HYSTERECTOMY WITH BILATERAL SALPINGECTOMY;  Surgeon: Richarda Overlie, MD;  Location: [facility] [place];  Service: Gynecology;  Laterality: Bilateral;   LAPAROSCOPY N/A 05/12/2021   Procedure: LAPAROSCOPY DIAGNOSTIC;  Surgeon: Richarda Overlie, MD;  Location: [facility];  Service: Gynecology;  Laterality: N/A;   LAPAROSCOPY BILATERAL TUBAL WITH FILCHIE CLIPS/   D & C HYSTEROSCOPY ENDOMETRIAL NOVASURE ABLATION  03-01-2009   LAPAROTOMY Bilateral 05/12/2021   Procedure: EXPLORATORY LAPAROTOMY, LYSIS OF ADHESIONS, BILATERAL salpingo-OOPHERECTOMU;  Surgeon: Richarda Overlie, MD;  Location: [facility];  Service: Gynecology;  Laterality: Bilateral;    OB History   No obstetric history on file.      Home Medications    Prior to Admission medications   Medication Sig Start Date End Date Taking? Authorizing Provider  fluconazole (DIFLUCAN) 150 MG tablet Take 1 tablet (150 mg total) by mouth daily for 2 doses. Take 1 tablet today and may repeat in 3 days if symptoms persist 03/20/23 03/22/23 Yes Radford Pax, NP  estradiol (VIVELLE-DOT) 0.1 MG/24HR patch 1 patch 2 (two) times a week. 08/11/21   [provider]  hydrochlorothiazide (HYDRODIURIL) 12.5 MG tablet Take 1 tablet (12.5 mg total) by mouth daily. 12/26/21 03/26/22  Rema Fendt, NP  ibuprofen (ADVIL) 600 MG tablet Take 1 tablet (600 mg total) by mouth every 6 (six) hours. 05/13/21   Richarda Overlie, MD  lisinopril (  ZESTRIL) 10 MG tablet Take 1 tablet (10 mg total) by mouth daily. 12/26/21 03/26/22  Rema Fendt, NP  metroNIDAZOLE (FLAGYL) 500 MG tablet Take 1 tablet (500 mg total) by mouth 2 (two) times daily. 02/16/23   Raspet, Noberto Retort, PA-C  Multiple Vitamin (MULTIVITAMIN ADULT PO) Take by mouth.    [provider]  ondansetron (ZOFRAN) 4 MG tablet Take 1 tablet (4 mg total) by mouth every 6  (six) hours as needed for nausea. 05/13/21   Richarda Overlie, MD  phentermine 37.5 MG capsule Take 1 capsule (37.5 mg total) by mouth every morning. 11/12/21 12/12/21  Rema Fendt, NP  polyethylene glycol powder (GLYCOLAX/MIRALAX) 17 GM/SCOOP powder Take 255 g by mouth daily. 01/01/21   Jenel Lucks, MD    Family History Family History  Problem Relation Age of Onset   Hypertension Mother    Breast cancer Mother 84       currently 51   Hypertension Father    Heart disease Father    Diabetes Father    Pancreatic cancer Maternal Aunt        diagnosed 51s; currently 14s   Prostate cancer Maternal Grandfather 6       currently 65s    Social History Social History   Tobacco Use   Smoking status: Never    Passive exposure: Never   Smokeless tobacco: Never  Vaping Use   Vaping status: Never Used  Substance Use Topics   Alcohol use: Yes    Comment: SOCIAL   Drug use: No     Allergies   Patient has no known allergies.   Review of Systems Review of Systems  Genitourinary:  Positive for vaginal discharge.     Physical Exam Triage Vital Signs ED Triage Vitals  Encounter Vitals Group     BP 03/20/23 1450 (!) 159/105     Systolic BP Percentile --      Diastolic BP Percentile --      Pulse Rate 03/20/23 1450 62     Resp 03/20/23 1450 16     Temp 03/20/23 1450 98.5 F (36.9 C)     Temp Source 03/20/23 1450 Oral     SpO2 03/20/23 1450 98 %     Weight --      Height --      Head Circumference --      Peak Flow --      Pain Score 03/20/23 1448 0     Pain Loc --      Pain Education --      Exclude from Growth Chart --    No data found.  Updated Vital Signs BP (!) 159/105 (BP Location: Left Arm)   Pulse 62   Temp 98.5 F (36.9 C) (Oral)   Resp 16   LMP 04/02/2015 (Exact Date)   SpO2 98%   Visual Acuity Right Eye Distance:   Left Eye Distance:   Bilateral Distance:    Right Eye Near:   Left Eye Near:    Bilateral Near:     Physical  Exam Vitals and nursing note reviewed.  Constitutional:      Appearance: Normal appearance.  HENT:     Head: Normocephalic and atraumatic.  Eyes:     Pupils: Pupils are equal, round, and reactive to light.  Cardiovascular:     Rate and Rhythm: Normal rate.  Pulmonary:     Effort: Pulmonary effort is normal.  Abdominal:     Tenderness: There is  no right CVA tenderness or left CVA tenderness.  Skin:    General: Skin is warm and dry.  Neurological:     General: No focal deficit present.     Mental Status: She is alert and oriented to person, place, and time.  Psychiatric:        Mood and Affect: Mood normal.        Behavior: Behavior normal.      UC Treatments / Results  Labs (all labs ordered are listed, but only abnormal results are displayed) Labs Reviewed  CERVICOVAGINAL ANCILLARY ONLY    EKG   Radiology No results found.  Procedures Procedures (including critical care time)  Medications Ordered in UC Medications - No data to display  Initial Impression / Assessment and Plan / UC Course  I have reviewed the triage vital signs and the nursing notes.  Pertinent labs & imaging results that were available during my care of the patient were reviewed by me and considered in my medical decision making (see chart for details).     Reviewed exam and symptoms with patient.  No red flags.  Vaginal swab was ordered we will contact for any positive results.  As patient reports symptoms are consistent with previous yeast infections will start Diflucan.  Patient to follow-up with PCP or GYN if symptoms do not improve.  ER precautions reviewed. Final Clinical Impressions(s) / UC Diagnoses   Final diagnoses:  Acute vaginitis     Discharge Instructions      The clinic will contact you with results of the vaginal swab done today if positive.  Start Diflucan as prescribed.  Please follow-up with your PCP or GYN if symptoms do not improve.  Please go to the ER if you  develop any worsening symptoms.  Please go to the ER if you develop any worsening symptoms.  Hope you feel better soon!    ED Prescriptions     Medication Sig Dispense Auth. Provider   fluconazole (DIFLUCAN) 150 MG tablet Take 1 tablet (150 mg total) by mouth daily for 2 doses. Take 1 tablet today and may repeat in 3 days if symptoms persist 2 tablet Radford Pax, NP      PDMP not reviewed this encounter.   Radford Pax, NP 03/20/23 [phone]

## 2023-03-20 NOTE — Discharge Instructions (Signed)
The clinic will contact you with results of the vaginal swab done today if positive.  Start Diflucan as prescribed.  Please follow-up with your PCP or GYN if symptoms do not improve.  Please go to the ER if you develop any worsening symptoms.  Please go to the ER if you develop any worsening symptoms.  Hope you feel better soon!

## 2023-03-20 NOTE — ED Triage Notes (Signed)
Pt presents to UC for c/o abd cramping x4 days. Thick white vaginal discharge after intercourse. Hx recurring yeast infection

## 2023-03-21 ENCOUNTER — Ambulatory Visit: Payer: 59

## 2023-03-22 LAB — CERVICOVAGINAL ANCILLARY ONLY
Bacterial Vaginitis (gardnerella): NEGATIVE
Candida Glabrata: NEGATIVE
Candida Vaginitis: NEGATIVE
Chlamydia: NEGATIVE
Comment: NEGATIVE
Comment: NEGATIVE
Comment: NEGATIVE
Comment: NEGATIVE
Comment: NEGATIVE
Comment: NORMAL
Neisseria Gonorrhea: NEGATIVE
Trichomonas: NEGATIVE

## 2023-12-06 ENCOUNTER — Other Ambulatory Visit: Payer: Self-pay | Admitting: Obstetrics and Gynecology

## 2023-12-06 DIAGNOSIS — R928 Other abnormal and inconclusive findings on diagnostic imaging of breast: Secondary | ICD-10-CM

## 2023-12-14 ENCOUNTER — Ambulatory Visit

## 2023-12-14 ENCOUNTER — Ambulatory Visit
Admission: RE | Admit: 2023-12-14 | Discharge: 2023-12-14 | Disposition: A | Discharge status: Home / Self Care | Source: Ambulatory Visit | Attending: Obstetrics and Gynecology | Admitting: Obstetrics and Gynecology

## 2023-12-14 DIAGNOSIS — R928 Other abnormal and inconclusive findings on diagnostic imaging of breast: Secondary | ICD-10-CM

## 2024-04-20 ENCOUNTER — Emergency Department (HOSPITAL_BASED_OUTPATIENT_CLINIC_OR_DEPARTMENT_OTHER)

## 2024-04-20 ENCOUNTER — Other Ambulatory Visit: Payer: Self-pay

## 2024-04-20 ENCOUNTER — Emergency Department (HOSPITAL_BASED_OUTPATIENT_CLINIC_OR_DEPARTMENT_OTHER)
Admission: EM | Admit: 2024-04-20 | Discharge: 2024-04-20 | Disposition: A | Discharge status: Home / Self Care | Source: Ambulatory Visit

## 2024-04-20 ENCOUNTER — Encounter (HOSPITAL_BASED_OUTPATIENT_CLINIC_OR_DEPARTMENT_OTHER): Payer: Self-pay | Admitting: Emergency Medicine

## 2024-04-20 DIAGNOSIS — Z79899 Other long term (current) drug therapy: Secondary | ICD-10-CM | POA: Diagnosis not present

## 2024-04-20 DIAGNOSIS — K660 Peritoneal adhesions (postprocedural) (postinfection): Secondary | ICD-10-CM | POA: Diagnosis not present

## 2024-04-20 DIAGNOSIS — R1032 Left lower quadrant pain: Secondary | ICD-10-CM

## 2024-04-20 LAB — URINALYSIS, ROUTINE W REFLEX MICROSCOPIC
Bacteria, UA: NONE SEEN
Bilirubin Urine: NEGATIVE
Glucose, UA: NEGATIVE mg/dL
Ketones, ur: NEGATIVE mg/dL
Nitrite: NEGATIVE
Specific Gravity, Urine: 1.023 (ref 1.005–1.030)
pH: 6 (ref 5.0–8.0)

## 2024-04-20 LAB — CBC WITH DIFFERENTIAL/PLATELET
Abs Immature Granulocytes: 0.04 10*3/uL (ref 0.00–0.07)
Basophils Absolute: 0.1 10*3/uL (ref 0.0–0.1)
Basophils Relative: 1 %
Eosinophils Absolute: 0.1 10*3/uL (ref 0.0–0.5)
Eosinophils Relative: 1 %
HCT: 35.1 % — ABNORMAL LOW (ref 36.0–46.0)
Hemoglobin: 11.2 g/dL — ABNORMAL LOW (ref 12.0–15.0)
Immature Granulocytes: 1 %
Lymphocytes Relative: 57 %
Lymphs Abs: 2.8 10*3/uL (ref 0.7–4.0)
MCH: 25.3 pg — ABNORMAL LOW (ref 26.0–34.0)
MCHC: 31.9 g/dL (ref 30.0–36.0)
MCV: 79.2 fL — ABNORMAL LOW (ref 80.0–100.0)
Monocytes Absolute: 0.5 10*3/uL (ref 0.1–1.0)
Monocytes Relative: 9 %
Neutro Abs: 1.5 10*3/uL — ABNORMAL LOW (ref 1.7–7.7)
Neutrophils Relative %: 31 %
Platelets: 214 10*3/uL (ref 150–400)
RBC: 4.43 MIL/uL (ref 3.87–5.11)
RDW: 14.6 % (ref 11.5–15.5)
WBC: 4.9 10*3/uL (ref 4.0–10.5)
nRBC: 0 % (ref 0.0–0.2)

## 2024-04-20 LAB — COMPREHENSIVE METABOLIC PANEL WITH GFR
ALT: 106 U/L — ABNORMAL HIGH (ref 0–44)
AST: 48 U/L — ABNORMAL HIGH (ref 15–41)
Albumin: 4.3 g/dL (ref 3.5–5.0)
Alkaline Phosphatase: 85 U/L (ref 38–126)
Anion gap: 11 (ref 5–15)
BUN: 7 mg/dL (ref 6–20)
CO2: 27 mmol/L (ref 22–32)
Calcium: 9.6 mg/dL (ref 8.9–10.3)
Chloride: 101 mmol/L (ref 98–111)
Creatinine, Ser: 0.81 mg/dL (ref 0.44–1.00)
GFR, Estimated: >60 mL/min
Glucose, Bld: 93 mg/dL (ref 70–99)
Potassium: 3.8 mmol/L (ref 3.5–5.1)
Sodium: 139 mmol/L (ref 135–145)
Total Bilirubin: 0.5 mg/dL (ref 0.0–1.2)
Total Protein: 8 g/dL (ref 6.5–8.1)

## 2024-04-20 LAB — LIPASE, BLOOD: Lipase: 21 U/L (ref 11–51)

## 2024-04-20 MED ORDER — AMOXICILLIN-POT CLAVULANATE 875-125 MG PO TABS: 1.0000 | ORAL_TABLET | Freq: Two times a day (BID) | ORAL | 0 refills | Status: AC

## 2024-04-20 MED ORDER — IOHEXOL 300 MG/ML  SOLN
100.0000 mL | Freq: Once | INTRAMUSCULAR | Status: AC | PRN
  Administered 2024-04-20: 100 mL via INTRAVENOUS

## 2024-04-20 NOTE — ED Provider Notes (Signed)
 Assumed care from Dr. Jackquline patient's ultrasound shows mildly complex left adnexal cystic structure which could be an endometrioma or possible abscess and a cystic structure on the right as well.  Low suspicion for abscess as patient is not having infectious symptoms.  Also pain is very tolerable.  Well-appearing on exam.  Patient will need to follow-up with her OB/GYN for MRI in the future.   Doretha Folks, MD 04/20/24 1756

## 2024-04-20 NOTE — Discharge Instructions (Addendum)
 As we discussed, you have this abnormal structure in your lower abdomen along the site where you are having pain.  It is unclear exactly what this is.  We definitely recommend you have an MRI abdomen outpatient.  Please follow-up with a gynecologist to have this completed.  I did speak to the office and they want you to call to establish care again.  Will treat you with Augmentin .  This is not because of concern for diverticulitis but rather because of your multiple dental caries.  Please continue follow-up with your dentist.  It is important that you  come back you having, worsening pain, fever, chills, passing bloody stool or excessive diarrhea.  This would all be concerning findings that we will to see immediately for.   We also want you to follow-up with your gastroenterologist.  You may need a colonoscopy in the future.  Please give them a call.

## 2024-04-20 NOTE — ED Notes (Signed)
 Ambulatory to restroom

## 2024-04-20 NOTE — ED Provider Notes (Signed)
 " [place] EMERGENCY DEPARTMENT AT [facility] Provider Note   CSN: [id number] Arrival date & time: 04/20/24  1028     Patient presents with: Pelvic Pain   Kristen Hines is a 44 y.o. female.   HPI    Patient presents with mouth pain as well as abdominal pain.  In terms of mouth pain, has a history of dental caries.  In the process of follow-up with dentistry to have these removed.  Patient states that she noticed what she felt was an abscess last week and her left lower molars.  This subsequently resolved.  Still has some slight pain in the area but no swelling.  Maybe had a minimal fever at about 100 F over the weekend.  No difficulty swallowing.  No mouth or oral swelling at this time.  No neck swelling.  Patient also endorses lower abdominal pain.  Mostly lower lower quadrant.  Previous history of total hysterectomy.  Nausea.  Bowel moods been normal.  No blood in stool that she can appreciate.  Denies a history of nephrolithiasis.  Maybe some slight flank pain on the left side.  No fever no chills.  No sick contacts.  No chest pain or shortness of breath.  No vaginal discharge.  Otherwise, denies all complaints.   Previous medical history reviewed : Follow-up with PCP yesterday.  Right lower quad abdominal pain left lower quad abdominal pain.  Sent to the ED for further examination.   Prior to Admission medications  Medication Sig Start Date End Date Taking? Authorizing Provider  amoxicillin -clavulanate (AUGMENTIN ) 875-125 MG tablet Take 1 tablet by mouth every 12 (twelve) hours for 10 days. 04/20/24 04/30/24 Yes Diane Mochizuki N, MD  estradiol (VIVELLE-DOT) 0.1 MG/24HR patch 1 patch 2 (two) times a week. 08/11/21   [provider]  hydrochlorothiazide  (HYDRODIURIL ) 12.5 MG tablet Take 1 tablet (12.5 mg total) by mouth daily. 12/26/21 03/26/22  Jaycee Greig PARAS, NP  ibuprofen  (ADVIL ) 600 MG tablet Take 1 tablet (600 mg total) by mouth every 6 (six) hours. 05/13/21    Johnnye Ade, MD  lisinopril  (ZESTRIL ) 10 MG tablet Take 1 tablet (10 mg total) by mouth daily. 12/26/21 03/26/22  Jaycee Greig PARAS, NP  metroNIDAZOLE  (FLAGYL ) 500 MG tablet Take 1 tablet (500 mg total) by mouth 2 (two) times daily. 02/16/23   Raspet, Erin K, PA-C  Multiple Vitamin (MULTIVITAMIN ADULT PO) Take by mouth.    [provider]  ondansetron  (ZOFRAN ) 4 MG tablet Take 1 tablet (4 mg total) by mouth every 6 (six) hours as needed for nausea. 05/13/21   Johnnye Ade, MD  phentermine  37.5 MG capsule Take 1 capsule (37.5 mg total) by mouth every morning. 11/12/21 12/12/21  Jaycee Greig PARAS, NP  polyethylene glycol powder (GLYCOLAX /MIRALAX ) 17 GM/SCOOP powder Take 255 g by mouth daily. 01/01/21   Stacia Glendia BRAVO, MD    Allergies: Patient has no known allergies.    Review of Systems  Constitutional:  Negative for chills and fever.  HENT:  Negative for ear pain and sore throat.   Eyes:  Negative for pain and visual disturbance.  Respiratory:  Negative for cough and shortness of breath.   Cardiovascular:  Negative for chest pain and palpitations.  Gastrointestinal:  Negative for abdominal pain and vomiting.  Genitourinary:  Negative for dysuria and hematuria.  Musculoskeletal:  Negative for arthralgias and back pain.  Skin:  Negative for color change and rash.  Neurological:  Negative for seizures and syncope.  All other  systems reviewed and are negative.   Updated Vital Signs BP (!) 149/94   Pulse (!) 55   Temp 98.6 F (37 C) (Oral)   Resp 18   Ht 5' 6 (1.676 m)   Wt 113.4 kg   LMP 04/02/2015   SpO2 100%   BMI 40.35 kg/m   Physical Exam Vitals and nursing note reviewed.  Constitutional:      General: She is not in acute distress.    Appearance: She is well-developed.  HENT:     Head: Normocephalic and atraumatic.     Mouth/Throat:   Eyes:     Conjunctiva/sclera: Conjunctivae normal.  Cardiovascular:     Rate and Rhythm: Normal rate and regular rhythm.      Heart sounds: No murmur heard. Pulmonary:     Effort: Pulmonary effort is normal. No respiratory distress.     Breath sounds: Normal breath sounds.  Abdominal:     Palpations: Abdomen is soft.     Tenderness: There is no abdominal tenderness.   Musculoskeletal:        General: No swelling.     Cervical back: Neck supple.  Skin:    General: Skin is warm and dry.     Capillary Refill: Capillary refill takes less than 2 seconds.  Neurological:     Mental Status: She is alert.  Psychiatric:        Mood and Affect: Mood normal.     (all labs ordered are listed, but only abnormal results are displayed) Labs Reviewed  URINALYSIS, ROUTINE W REFLEX MICROSCOPIC - Abnormal; Notable for the following components:      Result Value   Hgb urine dipstick TRACE (*)    Protein, ur TRACE (*)    Leukocytes,Ua TRACE (*)    All other components within normal limits  CBC WITH DIFFERENTIAL/PLATELET - Abnormal; Notable for the following components:   Hemoglobin 11.2 (*)    HCT 35.1 (*)    MCV 79.2 (*)    MCH 25.3 (*)    Neutro Abs 1.5 (*)    All other components within normal limits  COMPREHENSIVE METABOLIC PANEL WITH GFR - Abnormal; Notable for the following components:   AST 48 (*)    ALT 106 (*)    All other components within normal limits  LIPASE, BLOOD    EKG: None  Radiology: CT ABDOMEN PELVIS W CONTRAST Result Date: 04/20/2024 EXAM: CT ABDOMEN AND PELVIS WITH CONTRAST 04/20/2024 11:52:44 AM TECHNIQUE: CT of the abdomen and pelvis was performed with the administration of 100 mL of iohexol  (OMNIPAQUE ) 300 MG/ML solution. Multiplanar reformatted images are provided for review. Automated exposure control, iterative reconstruction, and/or weight-based adjustment of the mA/kV was utilized to reduce the radiation dose to as low as reasonably achievable. COMPARISON: None available. CLINICAL HISTORY: Left lower quadrant pain. Evaluate for diverticulitis. FINDINGS: LOWER CHEST: No acute  abnormality. LIVER: The liver is unremarkable. GALLBLADDER AND BILE DUCTS: Gallbladder is unremarkable. No biliary ductal dilatation. SPLEEN: The spleen is within normal limits in size and appearance. PANCREAS: The pancreas is normal in size and contour without focal lesion or ductal dilatation. ADRENAL GLANDS: Normal size and morphology bilaterally. No nodule, thickening, or hemorrhage. No periadrenal stranding. KIDNEYS, URETERS AND BLADDER: No stones in the kidneys or ureters. No hydronephrosis. No perinephric or periureteral stranding. Urinary bladder is unremarkable. GI AND BOWEL: The stomach appears normal. There is no pathologic dilatation of the bowel loops to suggest obstruction. The appendix is visualized and appears normal. Moderate  stool burden identified within the colon up to the splenic flexure. Colonic diverticulosis. No colonic wall thickening or surrounding inflammatory fat stranding. The sigmoid colon drapes around the left ovary and the cystic structure, with mild wall thickening of the segment adjacent to the left adnexal mass. PERITONEUM AND RETROPERITONEUM: No significant free fluid or fluid collections within the abdomen or pelvis. No loculated fluid collections, free fluid, or signs of pneumoperitoneum. VASCULATURE: Aorta is normal in caliber. LYMPH NODES: No lymphadenopathy. REPRODUCTIVE ORGANS: Within the left adnexa, there is a septated cyst or two subjacent cysts measuring 3.6 x 3.2 x 5.1 cm, coronal image 64/4 and axial image 68/2. The cyst(s) have Hounsfield units of 23. There is mild soft tissue stranding surrounding the left ovary with thickening of the adjacent peritoneal reflection. The mid sigmoid colon drapes around this cystic structure. BONES AND SOFT TISSUES: Visualized osseous structures are unremarkable. There is degenerative disc disease identified at L4-L5. No focal soft tissue abnormality. IMPRESSION: 1. Left adnexal septated cystic lesion measuring 3.6 x 3.2 x 5.1 cm with  mild surrounding inflammatory change and adjacent mild sigmoid wall thickening, most suspicious for gynecologic pathology (tubo-ovarian infection/early abscess or hemorrhagic cyst), with torsion less likely; recommend prompt pelvic ultrasound with Doppler and gynecology evaluation. 2. Given the patient's clinical history of left lower quadrant pain and evaluation for diverticulitis, the proximity of the sigmoid colon to the adnexal mass and its mild wall thickening also warrants consideration of a primary colonic process, such as diverticulitis, with secondary involvement of the adnexa. Electronically signed by: Waddell Calk MD 04/20/2024 01:14 PM EST RP Workstation: [id number]     Procedures   Medications Ordered in the ED  iohexol  (OMNIPAQUE ) 300 MG/ML solution 100 mL (100 mLs Intravenous Contrast Given 04/20/24 1149)    Clinical Course as of 04/20/24 1518  Thu Apr 20, 2024  1332 Could be post surgical: Could be abscess from diverticulitis. Possible ileus. Could be endometriosis.  [TL]  1354 Dr. Ebbie  [TL]    Clinical Course User Index [TL] Simon Lavonia SAILOR, MD                                 Medical Decision Making Amount and/or Complexity of Data Reviewed Labs: ordered. Radiology: ordered.  Risk Prescription drug management.     HPI:   Patient presents with mouth pain as well as abdominal pain.  In terms of mouth pain, has a history of dental caries.  In the process of follow-up with dentistry to have these removed.  Patient states that she noticed what she felt was an abscess last week and her left lower molars.  This subsequently resolved.  Still has some slight pain in the area but no swelling.  Maybe had a minimal fever at about 100 F over the weekend.  No difficulty swallowing.  No mouth or oral swelling at this time.  No neck swelling.  Patient also endorses lower abdominal pain.  Mostly lower lower quadrant.  Previous history of total hysterectomy.  Nausea.  Bowel  moods been normal.  No blood in stool that she can appreciate.  Denies a history of nephrolithiasis.  Maybe some slight flank pain on the left side.  No fever no chills.  No sick contacts.  No chest pain or shortness of breath.  No vaginal discharge.  Otherwise, denies all complaints.   Previous medical history reviewed : Follow-up with PCP yesterday.  Right lower  quad abdominal pain left lower quad abdominal pain.  Sent to the ED for further examination.   MDM:   Upon examination, patient hemodynamically stable. A&O x 3 with GCS 15.  Slight pain to palpation left lower quadrant.  Will assess with laboratory workup.  Obtain CT cannot rule out diverticulitis or colitis.  Also rule out nephrolithiasis.   In terms of left lower molar pain happened over the last weekend.  Multiple dental caries appreciated.  No obvious abscess I can see at this point in time.  Will consider Augmentin  for home.    Reevaluation:   Upon reexamination, patient hemodynamically stable.  Remains A&O x 3 with GCS 15.   No leukocytosis no left shift.   In terms of CT scan.  Initial CT scan was interpreted as possible tubo-ovarian abscess versus hemorrhagic cyst of the ovary.  Less likely be diverticulitis.  I contacted radiology.  Discussed the fact that patient's had a hysterectomy in the past.  Bilateral oophorectomy.  Somewhat complex surgical process where the right ovary was attached to the rectal wall which required with general surgery as well as OB/GYN.  Subsequently radiology looked at the imaging again.  Felt like it could be a cyst from the surgery which is benign but needs an MRI outpatient.  Less likely to be any diverticulitis with abscess.  I will reach out to physicians at [facility] group for further discussion to try and schedule outpatient MRI and outpatient visit for the patient.  Another thought process of could be endometriosis.  Feel like it is less likely to be diverticulitis.  No  diverticulosis was noted on CT scan.  Not febrile.  No leukocytosis.  No diarrhea recently.  No bright red blood in the stool.  Only fever that she had was last week in the setting of her mouth pain/swelling there is concern for dental infection.  She does have multiple dental caries that would explain this.   Other considerations that she had multiple adhesions during the surgery.  Question whether or not this could be contributing to the findings on CT scan.  Gynecology Dr. Candyce.  She reviewed the imaging.  Confirmed that patient has had a bilateral oophorectomy.  Felt like it was unlikely to be endometriosis.  She will message patient's gynecologist to have her seen outpatient for the MRI.   I spoke to general surgery Dr. Ebbie as well.  He took a look at the imaging.  Given signs and symptoms as well as CT images, he does not think this likely looks like diverticulitis with abscess.  He does not think there is anything surgical going on its time.  Would not recommend treatment for diverticulitis at this time.  I am in agreement with this based on the patient signs and symptoms.  No evident evidence of diverticulosis/diverticulitis.  No history of this as well.  Does not look like an abscess.  Does not not have any kind of laboratory findings to be concerning for abscess or other infectious etiology.   I do think it still reasonable to give her Augmentin  not because of diverticulitis but because of multiple dental caries appreciated in patient's mouth.  Maybe some slight erythema and redness of the gums in the left lower molars but no obvious abscess.  I think it is reasonable to treat for dental infection and follow-up with dentistry.  Will proceed with an ultrasound here.  See if this gives any more information about what this structure could be.  Plan on discharge the patient of the ultrasound shows no acute surgical process with outpatient follow-up with gynecology for MRI abdomen.  Discussed  this with the patient at length.  She feels comfortable with this.    Patient will be signed out pending ultrasound.    I have independently interpreted the  CT  images and agree with the radiologist finding     Disposition and Follow Up: gynecology, GI       Final diagnoses:  Left lower quadrant abdominal pain  Colonic adhesions    ED Discharge Orders          Ordered    amoxicillin -clavulanate (AUGMENTIN ) 875-125 MG tablet  Every 12 hours        04/20/24 1403               Simon Lavonia SAILOR, MD 04/20/24 1519  "

## 2024-04-20 NOTE — ED Notes (Signed)
 Patient transported to US 

## 2024-04-20 NOTE — ED Notes (Signed)
 ED Provider at bedside.

## 2024-04-20 NOTE — ED Notes (Signed)
 Reviewed discharge instructions, medications, and home care with pt. Pt verbalized understanding and had no further questions. Pt exited ED without complications.

## 2024-04-20 NOTE — ED Triage Notes (Signed)
 Pt Kristen Hines ambulatory NAD c/o pelvic pain on L side, sharp, constant. Denies urinary s/s, denies vaginal discharge or bleeding. Pt further reports she had felt pain and chills Friday and suspected she may have an abscess on the L side of mouth.
